# Patient Record
Sex: Female | Born: 1980 | Race: Black or African American | Hispanic: No | Marital: Single | Smoking: Current every day smoker
Health system: Southern US, Community
[De-identification: ages and names within clinical notes are randomized; demographics above are authoritative.]

## PROBLEM LIST (undated history)

## (undated) ENCOUNTER — Inpatient Hospital Stay (HOSPITAL_COMMUNITY): Payer: Self-pay

## (undated) DIAGNOSIS — K859 Acute pancreatitis without necrosis or infection, unspecified: Secondary | ICD-10-CM

## (undated) DIAGNOSIS — D259 Leiomyoma of uterus, unspecified: Secondary | ICD-10-CM

## (undated) DIAGNOSIS — A64 Unspecified sexually transmitted disease: Secondary | ICD-10-CM

## (undated) HISTORY — PX: NO PAST SURGERIES: SHX2092

---

## 2002-10-02 ENCOUNTER — Encounter (INDEPENDENT_AMBULATORY_CARE_PROVIDER_SITE_OTHER): Payer: Self-pay | Admitting: *Deleted

## 2002-10-02 ENCOUNTER — Encounter: Payer: Self-pay | Admitting: Obstetrics and Gynecology

## 2002-10-02 ENCOUNTER — Ambulatory Visit (HOSPITAL_COMMUNITY): Admission: AD | Admit: 2002-10-02 | Discharge: 2002-10-02 | Payer: Self-pay | Admitting: Obstetrics and Gynecology

## 2005-03-07 ENCOUNTER — Encounter: Admission: RE | Admit: 2005-03-07 | Discharge: 2005-03-07 | Payer: Self-pay | Admitting: Occupational Medicine

## 2009-06-20 ENCOUNTER — Ambulatory Visit: Payer: Self-pay | Admitting: Radiology

## 2009-06-20 ENCOUNTER — Emergency Department (HOSPITAL_BASED_OUTPATIENT_CLINIC_OR_DEPARTMENT_OTHER): Admission: EM | Admit: 2009-06-20 | Discharge: 2009-06-20 | Payer: Self-pay | Admitting: Emergency Medicine

## 2009-07-14 ENCOUNTER — Ambulatory Visit: Payer: Self-pay | Admitting: Diagnostic Radiology

## 2009-07-14 ENCOUNTER — Emergency Department (HOSPITAL_BASED_OUTPATIENT_CLINIC_OR_DEPARTMENT_OTHER): Admission: EM | Admit: 2009-07-14 | Discharge: 2009-07-14 | Payer: Self-pay | Admitting: Emergency Medicine

## 2010-08-25 ENCOUNTER — Emergency Department (HOSPITAL_BASED_OUTPATIENT_CLINIC_OR_DEPARTMENT_OTHER)
Admission: EM | Admit: 2010-08-25 | Discharge: 2010-08-26 | Disposition: A | Discharge status: Home / Self Care | Payer: Self-pay | Attending: Emergency Medicine | Admitting: Emergency Medicine

## 2010-08-25 DIAGNOSIS — T148 Other injury of unspecified body region: Secondary | ICD-10-CM | POA: Insufficient documentation

## 2010-08-25 DIAGNOSIS — R21 Rash and other nonspecific skin eruption: Secondary | ICD-10-CM | POA: Insufficient documentation

## 2010-08-25 DIAGNOSIS — Y92009 Unspecified place in unspecified non-institutional (private) residence as the place of occurrence of the external cause: Secondary | ICD-10-CM | POA: Insufficient documentation

## 2010-08-25 DIAGNOSIS — W57XXXA Bitten or stung by nonvenomous insect and other nonvenomous arthropods, initial encounter: Secondary | ICD-10-CM | POA: Insufficient documentation

## 2010-08-26 LAB — RAPID STREP SCREEN (MED CTR MEBANE ONLY): Streptococcus, Group A Screen (Direct): NEGATIVE

## 2010-10-22 NOTE — Op Note (Signed)
   NAME:  Ariana Gutierrez, Ariana Gutierrez                         ACCOUNT NO.:  [id number]   MEDICAL RECORD NO.:  [id number]                   PATIENT TYPE:  AMB   LOCATION:  SDC                                  FACILITY:  [facility]   PHYSICIAN:  Phil D. Okey Dupre, M.D.                  DATE OF BIRTH:  [birth year]   DATE OF PROCEDURE:  10/02/2002  DATE OF DISCHARGE:                                 OPERATIVE REPORT   PROCEDURE:  Dilatation and evacuation.   PREOPERATIVE DIAGNOSIS:  Missed and incomplete abortion.   POSTOPERATIVE DIAGNOSIS:  Missed and incomplete abortion.   SURGEON:  Javier Glazier. Okey Dupre, M.D.   ANESTHESIA:  General.   OPERATIVE FINDINGS:  A cervix that was dilated to easily allow a finger  through the cervical os, a uterus that was soft and boggy and about eight  weeks gestational size, and a large amount of secundine material.   DESCRIPTION OF PROCEDURE:  Under satisfactory general anesthesia with the  patient in the dorsal lithotomy position the perineum and vagina were  prepped and draped in the usual sterile manner.  Bimanual pelvic examination  was as above.  A weighted speculum was placed in the posterior fourchet of  the marital introitus.  BUS were within normal limits.  Vagina was clean and  well rugated with a large amount of clot within the cavity.  The anterior  lip of the cervix was grasped with a single tooth tenaculum.  The uterine  cavity sounded to about 9 cm.  The cervical os easily passed a #10 Hagar  dilator and a large curette was used to evacuate the uterine contents  without incident.  The uterine cavity was smooth with no abnormalities  noted.  The tenaculum and speculum were removed from the vagina.  The  patient was transferred to the recovery room in satisfactory condition,  having tolerated the procedure well.  The products of conception removed  from the uterine cavity were sent for pathological diagnosis.                                               Phil D. Okey Dupre,  M.D.    PDR/MEDQ  D:  10/02/2002  T:  10/02/2002  Job:  [id number]

## 2015-10-01 DIAGNOSIS — Z6831 Body mass index (BMI) 31.0-31.9, adult: Secondary | ICD-10-CM | POA: Diagnosis not present

## 2015-10-01 DIAGNOSIS — L03115 Cellulitis of right lower limb: Secondary | ICD-10-CM | POA: Diagnosis not present

## 2015-10-01 DIAGNOSIS — T310 Burns involving less than 10% of body surface: Secondary | ICD-10-CM | POA: Diagnosis not present

## 2015-10-06 DIAGNOSIS — T310 Burns involving less than 10% of body surface: Secondary | ICD-10-CM | POA: Diagnosis not present

## 2015-10-06 DIAGNOSIS — Z6831 Body mass index (BMI) 31.0-31.9, adult: Secondary | ICD-10-CM | POA: Diagnosis not present

## 2015-10-13 DIAGNOSIS — T300 Burn of unspecified body region, unspecified degree: Secondary | ICD-10-CM | POA: Diagnosis not present

## 2015-10-13 DIAGNOSIS — T310 Burns involving less than 10% of body surface: Secondary | ICD-10-CM | POA: Diagnosis not present

## 2015-10-13 DIAGNOSIS — Z23 Encounter for immunization: Secondary | ICD-10-CM | POA: Diagnosis not present

## 2015-10-13 DIAGNOSIS — Z6831 Body mass index (BMI) 31.0-31.9, adult: Secondary | ICD-10-CM | POA: Diagnosis not present

## 2015-10-16 DIAGNOSIS — T310 Burns involving less than 10% of body surface: Secondary | ICD-10-CM | POA: Diagnosis not present

## 2015-10-16 DIAGNOSIS — T300 Burn of unspecified body region, unspecified degree: Secondary | ICD-10-CM | POA: Diagnosis not present

## 2015-10-16 DIAGNOSIS — Z6831 Body mass index (BMI) 31.0-31.9, adult: Secondary | ICD-10-CM | POA: Diagnosis not present

## 2016-01-08 DIAGNOSIS — Z3480 Encounter for supervision of other normal pregnancy, unspecified trimester: Secondary | ICD-10-CM | POA: Diagnosis not present

## 2016-01-13 DIAGNOSIS — O26899 Other specified pregnancy related conditions, unspecified trimester: Secondary | ICD-10-CM | POA: Diagnosis not present

## 2016-01-13 DIAGNOSIS — O09521 Supervision of elderly multigravida, first trimester: Secondary | ICD-10-CM | POA: Diagnosis not present

## 2016-01-13 DIAGNOSIS — Z3A08 8 weeks gestation of pregnancy: Secondary | ICD-10-CM | POA: Diagnosis not present

## 2016-01-28 ENCOUNTER — Other Ambulatory Visit (HOSPITAL_COMMUNITY)
Admission: RE | Admit: 2016-01-28 | Discharge: 2016-01-28 | Disposition: A | Discharge status: Home / Self Care | Payer: BLUE CROSS/BLUE SHIELD | Source: Ambulatory Visit | Attending: Obstetrics & Gynecology | Admitting: Obstetrics & Gynecology

## 2016-01-28 ENCOUNTER — Other Ambulatory Visit: Payer: Self-pay | Admitting: Obstetrics & Gynecology

## 2016-01-28 DIAGNOSIS — Z1151 Encounter for screening for human papillomavirus (HPV): Secondary | ICD-10-CM | POA: Diagnosis not present

## 2016-01-28 DIAGNOSIS — Z113 Encounter for screening for infections with a predominantly sexual mode of transmission: Secondary | ICD-10-CM | POA: Insufficient documentation

## 2016-01-28 DIAGNOSIS — Z01419 Encounter for gynecological examination (general) (routine) without abnormal findings: Secondary | ICD-10-CM | POA: Insufficient documentation

## 2016-01-28 DIAGNOSIS — O0991 Supervision of high risk pregnancy, unspecified, first trimester: Secondary | ICD-10-CM | POA: Diagnosis not present

## 2016-02-01 LAB — CYTOLOGY - PAP

## 2016-02-23 ENCOUNTER — Inpatient Hospital Stay (HOSPITAL_COMMUNITY): Payer: BLUE CROSS/BLUE SHIELD

## 2016-02-23 ENCOUNTER — Encounter (HOSPITAL_COMMUNITY): Payer: Self-pay | Admitting: *Deleted

## 2016-02-23 ENCOUNTER — Inpatient Hospital Stay (HOSPITAL_COMMUNITY)
Admission: AD | Admit: 2016-02-23 | Discharge: 2016-02-23 | Disposition: A | Discharge status: Home / Self Care | Payer: BLUE CROSS/BLUE SHIELD | Source: Ambulatory Visit | Attending: Obstetrics & Gynecology | Admitting: Obstetrics & Gynecology

## 2016-02-23 DIAGNOSIS — O3412 Maternal care for benign tumor of corpus uteri, second trimester: Secondary | ICD-10-CM | POA: Insufficient documentation

## 2016-02-23 DIAGNOSIS — O209 Hemorrhage in early pregnancy, unspecified: Secondary | ICD-10-CM

## 2016-02-23 DIAGNOSIS — O4692 Antepartum hemorrhage, unspecified, second trimester: Secondary | ICD-10-CM

## 2016-02-23 DIAGNOSIS — Z3A14 14 weeks gestation of pregnancy: Secondary | ICD-10-CM | POA: Diagnosis not present

## 2016-02-23 DIAGNOSIS — D259 Leiomyoma of uterus, unspecified: Secondary | ICD-10-CM | POA: Diagnosis not present

## 2016-02-23 HISTORY — DX: Unspecified sexually transmitted disease: A64

## 2016-02-23 HISTORY — DX: Leiomyoma of uterus, unspecified: D25.9

## 2016-02-23 LAB — CBC
HCT: 34.6 % — ABNORMAL LOW (ref 36.0–46.0)
HEMOGLOBIN: 12.5 g/dL (ref 12.0–15.0)
MCH: 35.4 pg — ABNORMAL HIGH (ref 26.0–34.0)
MCHC: 36.1 g/dL — ABNORMAL HIGH (ref 30.0–36.0)
MCV: 98 fL (ref 78.0–100.0)
Platelets: 283 10*3/uL (ref 150–400)
RBC: 3.53 MIL/uL — ABNORMAL LOW (ref 3.87–5.11)
RDW: 12.2 % (ref 11.5–15.5)
WBC: 11.1 10*3/uL — AB (ref 4.0–10.5)

## 2016-02-23 LAB — ABO/RH: ABO/RH(D): B POS

## 2016-02-23 NOTE — Discharge Instructions (Signed)
Uterine Fibroids Uterine fibroids are tissue masses (tumors) that can develop in the womb (uterus). They are also called leiomyomas. This type of tumor is not cancerous (benign) and does not spread to other parts of the body outside of the pelvic area, which is between the hip bones. Occasionally, fibroids may develop in the fallopian tubes, in the cervix, or on the support structures (ligaments) that surround the uterus. You can have one or many fibroids. Fibroids can vary in size, weight, and where they grow in the uterus. Some can become quite large. Most fibroids do not require medical treatment. CAUSES A fibroid can develop when a single uterine cell keeps growing (replicating). Most cells in the human body have a control mechanism that keeps them from replicating without control. SIGNS AND SYMPTOMS Symptoms may include:   Heavy bleeding during your period.  Bleeding or spotting between periods.  Pelvic pain and pressure.  Bladder problems, such as needing to urinate more often (urinary frequency) or urgently.  Inability to reproduce offspring (infertility).  Miscarriages. DIAGNOSIS Uterine fibroids are diagnosed through a physical exam. Your health care provider may feel the lumpy tumors during a pelvic exam. Ultrasonography and an MRI may be done to determine the size, location, and number of fibroids. TREATMENT Treatment may include:  Watchful waiting. This involves getting the fibroid checked by your health care provider to see if it grows or shrinks. Follow your health care provider's recommendations for how often to have this checked.  Hormone medicines. These can be taken by mouth or given through an intrauterine device (IUD).  Surgery.  Removing the fibroids (myomectomy) or the uterus (hysterectomy).  Removing blood supply to the fibroids (uterine artery embolization). If fibroids interfere with your fertility and you want to become pregnant, your health care provider  may recommend having the fibroids removed.  HOME CARE INSTRUCTIONS  Keep all follow-up visits as directed by your health care provider. This is important.  Take medicines only as directed by your health care provider.  If you were prescribed a hormone treatment, take the hormone medicines exactly as directed.  Do not take aspirin, because it can cause bleeding.  Ask your health care provider about taking iron pills and increasing the amount of dark green, leafy vegetables in your diet. These actions can help to boost your blood iron levels, which may be affected by heavy menstrual bleeding.  Pay close attention to your period and tell your health care provider about any changes, such as:  Increased blood flow that requires you to use more pads or tampons than usual per month.  A change in the number of days that your period lasts per month.  A change in symptoms that are associated with your period, such as abdominal cramping or back pain. SEEK MEDICAL CARE IF:  You have pelvic pain, back pain, or abdominal cramps that cannot be controlled with medicines.  You have an increase in bleeding between and during periods.  You soak tampons or pads in a half hour or less.  You feel lightheaded, extra tired, or weak. SEEK IMMEDIATE MEDICAL CARE IF:  You faint.  You have a sudden increase in pelvic pain.   This information is not intended to replace advice given to you by your health care provider. Make sure you discuss any questions you have with your health care provider.   Document Released: 05/20/2000 Document Revised: 06/13/2014 Document Reviewed: 11/19/2013 Elsevier Interactive Patient Education 2016 [place].  Pelvic Rest Pelvic rest is sometimes  recommended for women when:   The placenta is partially or completely covering the opening of the cervix (placenta previa).  There is bleeding between the uterine wall and the amniotic sac in the first trimester (subchorionic  hemorrhage).  The cervix begins to open without labor starting (incompetent cervix, cervical insufficiency).  The labor is too early (preterm labor). HOME CARE INSTRUCTIONS  Do not have sexual intercourse, stimulation, or an orgasm.  Do not use tampons, douche, or put anything in the vagina.  Do not lift anything over 10 pounds (4.5 kg).  Avoid strenuous activity or straining your pelvic muscles. SEEK MEDICAL CARE IF:  You have any vaginal bleeding during pregnancy. Treat this as a potential emergency.  You have cramping pain felt low in the stomach (stronger than menstrual cramps).  You notice vaginal discharge (watery, mucus, or bloody).  You have a low, dull backache.  There are regular contractions or uterine tightening. SEEK IMMEDIATE MEDICAL CARE IF: You have vaginal bleeding and have placenta previa.    This information is not intended to replace advice given to you by your health care provider. Make sure you discuss any questions you have with your health care provider.   Document Released: 09/17/2010 Document Revised: 08/15/2011 Document Reviewed: 11/24/2014 Elsevier Interactive Patient Education 2016 [employer] Inc[employer]   Vaginal Bleeding During Pregnancy, Second Trimester A small amount of bleeding (spotting) from the vagina is relatively common in pregnancy. It usually stops on its own. Various things can cause bleeding or spotting in pregnancy. Some bleeding may be related to the pregnancy, and some may not. Sometimes the bleeding is normal and is not a problem. However, bleeding can also be a sign of something serious. Be sure to tell your health care provider about any vaginal bleeding right away. Some possible causes of vaginal bleeding during the second trimester include:  Infection, inflammation, or growths on the cervix.   The placenta may be partially or completely covering the opening of the cervix inside the uterus (placenta previa).  The placenta may have  separated from the uterus (abruption of the placenta).   You may be having early (preterm) labor.   The cervix may not be strong enough to keep a baby inside the uterus (cervical insufficiency).   Tiny cysts may have developed in the uterus instead of pregnancy tissue (molar pregnancy). HOME CARE INSTRUCTIONS  Watch your condition for any changes. The following actions may help to lessen any discomfort you are feeling:  Follow your health care provider's instructions for limiting your activity. If your health care provider orders bed rest, you may need to stay in bed and only get up to use the bathroom. However, your health care provider may allow you to continue light activity.  If needed, make plans for someone to help with your regular activities and responsibilities while you are on bed rest.  Keep track of the number of pads you use each day, how often you change pads, and how soaked (saturated) they are. Write this down.  Do not use tampons. Do not douche.  Do not have sexual intercourse or orgasms until approved by your health care provider.  If you pass any tissue from your vagina, save the tissue so you can show it to your health care provider.  Only take over-the-counter or prescription medicines as directed by your health care provider.  Do not take aspirin because it can make you bleed.  Do not exercise or perform any strenuous activities or heavy lifting without  your health care provider's permission.  Keep all follow-up appointments as directed by your health care provider. SEEK MEDICAL CARE IF:  You have any vaginal bleeding during any part of your pregnancy.  You have cramps or labor pains.  You have a fever, not controlled by medicine. SEEK IMMEDIATE MEDICAL CARE IF:   You have severe cramps in your back or belly (abdomen).  You have contractions.  You have chills.  You pass large clots or tissue from your vagina.  Your bleeding increases.  You  feel light-headed or weak, or you have fainting episodes.  You are leaking fluid or have a gush of fluid from your vagina. MAKE SURE YOU:  Understand these instructions.  Will watch your condition.  Will get help right away if you are not doing well or get worse.   This information is not intended to replace advice given to you by your health care provider. Make sure you discuss any questions you have with your health care provider.   Document Released: 03/02/2005 Document Revised: 05/28/2013 Document Reviewed: 01/28/2013 Elsevier Interactive Patient Education [employer].

## 2016-02-23 NOTE — MAU Note (Signed)
Pt states bleeding started @ 1440, felt a cramp at work, felt gushing.  Denies pain now.  Has bled through clothes, had large pad on outside of clothes.

## 2016-02-23 NOTE — MAU Provider Note (Signed)
History     CSN: [id number]  Arrival date and time: 02/23/16 1544   First Provider Initiated Contact with Patient 02/23/16 1905      Chief Complaint  Patient presents with  . Vaginal Bleeding   HPI Ariana Gutierrez is a 35 y.o. G3P1011 at [redacted]w[redacted]d who presents with vaginal bleeding. Reports heavy vaginal bleeding began ~1 hour PTA. States she saturated her clothes & blood was running down her legs. Had abdominal pain at moment the bleeding began, but none since then. Denies bleeding at this time.  See Dr. Nelda Marseille. Was told a few months ago that she has "some fibroids". Next appt with Dr. Nelda Marseille is this Friday.   OB History    Gravida Para Term Preterm AB Living   3 1 1   1 1    SAB TAB Ectopic Multiple Live Births   1 0     1      Past Medical History:  Diagnosis Date  . STI (sexually transmitted infection)    hx of gonorrhea & chlamydia "years ago"  . Uterine fibroid     Past Surgical History:  Procedure Laterality Date  . NO PAST SURGERIES      Family History  Problem Relation Age of Onset  . Hypertension Mother   . Diabetes Maternal Aunt   . Asthma Neg Hx   . Cancer Neg Hx   . Heart disease Neg Hx     Social History  Substance Use Topics  . Smoking status: Not on file  . Smokeless tobacco: Not on file  . Alcohol use Not on file    Allergies: No Known Allergies  No prescriptions prior to admission.    Review of Systems  Gastrointestinal: Negative.   Genitourinary:       + vaginal bleeding   Physical Exam   Blood pressure 118/72, pulse 88, temperature 97.5 F (36.4 C), temperature source Oral, resp. rate 18.  Physical Exam  Nursing note and vitals reviewed. Constitutional: She is oriented to person, place, and time. She appears well-developed and well-nourished. No distress.  HENT:  Head: Normocephalic and atraumatic.  Eyes: Conjunctivae are normal. Right eye exhibits no discharge. Left eye exhibits no discharge. No scleral icterus.  Neck: Normal range  of motion.  Cardiovascular: Normal rate.   Respiratory: Effort normal. No respiratory distress.  GI: Soft. There is no tenderness.  Genitourinary:  Genitourinary Comments: Small amount of dark red blood ~4 cm blood clot removed from canal Cervix closed  Neurological: She is alert and oriented to person, place, and time.  Skin: Skin is warm and dry. She is not diaphoretic.  Psychiatric: She has a normal mood and affect. Her behavior is normal. Judgment and thought content normal.    MAU Course  Procedures Results for orders placed or performed during the hospital encounter of 02/23/16 (from the past 24 hour(s))  CBC     Status: Abnormal   Collection Time: 02/23/16  5:54 PM  Result Value Ref Range   WBC 11.1 (H) 4.0 - 10.5 K/uL   RBC 3.53 (L) 3.87 - 5.11 MIL/uL   Hemoglobin 12.5 12.0 - 15.0 g/dL   HCT 34.6 (L) 36.0 - 46.0 %   MCV 98.0 78.0 - 100.0 fL   MCH 35.4 (H) 26.0 - 34.0 pg   MCHC 36.1 (H) 30.0 - 36.0 g/dL   RDW 12.2 11.5 - 15.5 %   Platelets 283 150 - 400 K/uL  ABO/Rh     Status: None (Preliminary  result)   Collection Time: 02/23/16  5:54 PM  Result Value Ref Range   ABO/RH(D) B POS     MDM FHT 154 by doppler B positive CBC & ultrasound Cervix closed Ultrasound shows SIUP with cardiac activity, anterior placenta, & multiple uterine fibroids  Discussed pt with Dr. Simona Huh & Dr. Landry Mellow. Dr. Landry Mellow in to see pt for reassurance. Pt to be discharged home.  Assessment and Plan  A: 1. Uterine fibroids affecting pregnancy in second trimester   2. [redacted] weeks gestation of pregnancy   3. Vaginal bleeding in pregnancy, second trimester     P: Discharge home Pelvic rest No work until f/u with Dr. Nelda Marseille Discussed reasons to return to MAU Keep appt with Dr. Nelda Marseille on Friday  Jorje Guild 02/23/2016, 4:28 PM

## 2016-02-26 DIAGNOSIS — Z3482 Encounter for supervision of other normal pregnancy, second trimester: Secondary | ICD-10-CM | POA: Diagnosis not present

## 2016-03-17 DIAGNOSIS — Z3480 Encounter for supervision of other normal pregnancy, unspecified trimester: Secondary | ICD-10-CM | POA: Diagnosis not present

## 2016-03-17 DIAGNOSIS — Z23 Encounter for immunization: Secondary | ICD-10-CM | POA: Diagnosis not present

## 2016-03-30 DIAGNOSIS — Z3A19 19 weeks gestation of pregnancy: Secondary | ICD-10-CM | POA: Diagnosis not present

## 2016-03-30 DIAGNOSIS — Z36 Encounter for antenatal screening for chromosomal anomalies: Secondary | ICD-10-CM | POA: Diagnosis not present

## 2016-03-30 DIAGNOSIS — O09522 Supervision of elderly multigravida, second trimester: Secondary | ICD-10-CM | POA: Diagnosis not present

## 2016-03-30 DIAGNOSIS — O3412 Maternal care for benign tumor of corpus uteri, second trimester: Secondary | ICD-10-CM | POA: Diagnosis not present

## 2016-05-04 DIAGNOSIS — N898 Other specified noninflammatory disorders of vagina: Secondary | ICD-10-CM | POA: Diagnosis not present

## 2016-05-16 DIAGNOSIS — R109 Unspecified abdominal pain: Secondary | ICD-10-CM | POA: Diagnosis not present

## 2016-05-16 DIAGNOSIS — O0992 Supervision of high risk pregnancy, unspecified, second trimester: Secondary | ICD-10-CM | POA: Diagnosis not present

## 2016-05-16 DIAGNOSIS — O26899 Other specified pregnancy related conditions, unspecified trimester: Secondary | ICD-10-CM | POA: Diagnosis not present

## 2016-05-18 DIAGNOSIS — O09529 Supervision of elderly multigravida, unspecified trimester: Secondary | ICD-10-CM | POA: Diagnosis not present

## 2016-05-18 DIAGNOSIS — O2612 Low weight gain in pregnancy, second trimester: Secondary | ICD-10-CM | POA: Diagnosis not present

## 2016-05-18 DIAGNOSIS — D259 Leiomyoma of uterus, unspecified: Secondary | ICD-10-CM | POA: Diagnosis not present

## 2016-05-18 DIAGNOSIS — O3412 Maternal care for benign tumor of corpus uteri, second trimester: Secondary | ICD-10-CM | POA: Diagnosis not present

## 2016-06-06 NOTE — L&D Delivery Note (Signed)
Delivery Note At 12:31 PM a viable female was delivered via Vaginal, Spontaneous Delivery (Presentation: ROA ).  APGAR: 8, 9; weight 6 lb 13.2 oz (3095 g).   Placenta status; L&D .  Cord:  with the following complications: none Cord pH: not collected  Anesthesia:  epidural Episiotomy: None Lacerations: 1st degree;Vaginal Suture Repair: 3.0 vicryl Est. Blood Loss (mL): 350  Mom to postpartum.  Baby to Couplet care / Skin to Skin.  Janyth Pupa, M 08/02/2016, 2:57 PM

## 2016-06-15 DIAGNOSIS — O3413 Maternal care for benign tumor of corpus uteri, third trimester: Secondary | ICD-10-CM | POA: Diagnosis not present

## 2016-06-15 DIAGNOSIS — O09529 Supervision of elderly multigravida, unspecified trimester: Secondary | ICD-10-CM | POA: Diagnosis not present

## 2016-07-01 DIAGNOSIS — O3413 Maternal care for benign tumor of corpus uteri, third trimester: Secondary | ICD-10-CM | POA: Diagnosis not present

## 2016-07-11 DIAGNOSIS — O09529 Supervision of elderly multigravida, unspecified trimester: Secondary | ICD-10-CM | POA: Diagnosis not present

## 2016-07-11 DIAGNOSIS — O3413 Maternal care for benign tumor of corpus uteri, third trimester: Secondary | ICD-10-CM | POA: Diagnosis not present

## 2016-07-21 LAB — OB RESULTS CONSOLE GBS: GBS: POSITIVE

## 2016-08-02 ENCOUNTER — Inpatient Hospital Stay (HOSPITAL_COMMUNITY): Payer: BLUE CROSS/BLUE SHIELD | Admitting: Anesthesiology

## 2016-08-02 ENCOUNTER — Inpatient Hospital Stay (HOSPITAL_COMMUNITY)
Admission: AD | Admit: 2016-08-02 | Discharge: 2016-08-03 | DRG: 775 | Disposition: A | Discharge status: Home / Self Care | Payer: BLUE CROSS/BLUE SHIELD | Source: Ambulatory Visit | Attending: Obstetrics and Gynecology | Admitting: Obstetrics and Gynecology

## 2016-08-02 ENCOUNTER — Encounter (HOSPITAL_COMMUNITY): Payer: Self-pay

## 2016-08-02 DIAGNOSIS — E669 Obesity, unspecified: Secondary | ICD-10-CM | POA: Diagnosis not present

## 2016-08-02 DIAGNOSIS — Z3A37 37 weeks gestation of pregnancy: Secondary | ICD-10-CM

## 2016-08-02 DIAGNOSIS — O4202 Full-term premature rupture of membranes, onset of labor within 24 hours of rupture: Secondary | ICD-10-CM | POA: Diagnosis not present

## 2016-08-02 DIAGNOSIS — Z833 Family history of diabetes mellitus: Secondary | ICD-10-CM

## 2016-08-02 DIAGNOSIS — O99824 Streptococcus B carrier state complicating childbirth: Secondary | ICD-10-CM | POA: Diagnosis not present

## 2016-08-02 DIAGNOSIS — O99214 Obesity complicating childbirth: Secondary | ICD-10-CM | POA: Diagnosis not present

## 2016-08-02 DIAGNOSIS — A5901 Trichomonal vulvovaginitis: Secondary | ICD-10-CM | POA: Diagnosis not present

## 2016-08-02 DIAGNOSIS — Z8249 Family history of ischemic heart disease and other diseases of the circulatory system: Secondary | ICD-10-CM

## 2016-08-02 DIAGNOSIS — Z6831 Body mass index (BMI) 31.0-31.9, adult: Secondary | ICD-10-CM | POA: Diagnosis not present

## 2016-08-02 DIAGNOSIS — D259 Leiomyoma of uterus, unspecified: Secondary | ICD-10-CM | POA: Diagnosis not present

## 2016-08-02 DIAGNOSIS — O09529 Supervision of elderly multigravida, unspecified trimester: Secondary | ICD-10-CM

## 2016-08-02 DIAGNOSIS — O3413 Maternal care for benign tumor of corpus uteri, third trimester: Secondary | ICD-10-CM | POA: Diagnosis present

## 2016-08-02 DIAGNOSIS — Z9289 Personal history of other medical treatment: Secondary | ICD-10-CM | POA: Diagnosis present

## 2016-08-02 DIAGNOSIS — O23592 Infection of other part of genital tract in pregnancy, second trimester: Secondary | ICD-10-CM

## 2016-08-02 DIAGNOSIS — B951 Streptococcus, group B, as the cause of diseases classified elsewhere: Secondary | ICD-10-CM | POA: Diagnosis present

## 2016-08-02 LAB — CBC
HCT: 36.8 % (ref 36.0–46.0)
HEMOGLOBIN: 12.4 g/dL (ref 12.0–15.0)
MCH: 32.7 pg (ref 26.0–34.0)
MCHC: 33.7 g/dL (ref 30.0–36.0)
MCV: 97.1 fL (ref 78.0–100.0)
Platelets: 299 10*3/uL (ref 150–400)
RBC: 3.79 MIL/uL — ABNORMAL LOW (ref 3.87–5.11)
RDW: 14.3 % (ref 11.5–15.5)
WBC: 10.8 10*3/uL — AB (ref 4.0–10.5)

## 2016-08-02 LAB — OB RESULTS CONSOLE HEPATITIS B SURFACE ANTIGEN: HEP B S AG: NEGATIVE

## 2016-08-02 LAB — OB RESULTS CONSOLE RPR: RPR: NONREACTIVE

## 2016-08-02 LAB — OB RESULTS CONSOLE HIV ANTIBODY (ROUTINE TESTING): HIV: NONREACTIVE

## 2016-08-02 LAB — TYPE AND SCREEN
ABO/RH(D): B POS
ANTIBODY SCREEN: NEGATIVE

## 2016-08-02 LAB — OB RESULTS CONSOLE RUBELLA ANTIBODY, IGM: RUBELLA: IMMUNE

## 2016-08-02 LAB — POCT FERN TEST: POCT Fern Test: POSITIVE

## 2016-08-02 MED ORDER — LIDOCAINE HCL (PF) 1 % IJ SOLN
INTRAMUSCULAR | Status: DC | PRN
  Administered 2016-08-02 (×2): 4 mL via EPIDURAL

## 2016-08-02 MED ORDER — METHYLERGONOVINE MALEATE 0.2 MG/ML IJ SOLN
INTRAMUSCULAR | Status: AC
  Filled 2016-08-02: qty 1

## 2016-08-02 MED ORDER — BUTORPHANOL TARTRATE 1 MG/ML IJ SOLN
2.0000 mg | Freq: Once | INTRAMUSCULAR | Status: AC
  Administered 2016-08-02: 2 mg via INTRAVENOUS
  Filled 2016-08-02: qty 2

## 2016-08-02 MED ORDER — PHENYLEPHRINE 40 MCG/ML (10ML) SYRINGE FOR IV PUSH (FOR BLOOD PRESSURE SUPPORT)
80.0000 ug | PREFILLED_SYRINGE | INTRAVENOUS | Status: DC | PRN
  Filled 2016-08-02: qty 10
  Filled 2016-08-02: qty 5

## 2016-08-02 MED ORDER — IBUPROFEN 600 MG PO TABS
600.0000 mg | ORAL_TABLET | Freq: Four times a day (QID) | ORAL | Status: DC
  Administered 2016-08-02 – 2016-08-03 (×4): 600 mg via ORAL
  Filled 2016-08-02 (×5): qty 1

## 2016-08-02 MED ORDER — BENZOCAINE-MENTHOL 20-0.5 % EX AERO
1.0000 "application " | INHALATION_SPRAY | CUTANEOUS | Status: DC | PRN
  Administered 2016-08-03: 1 via TOPICAL
  Filled 2016-08-02: qty 56

## 2016-08-02 MED ORDER — ONDANSETRON HCL 4 MG/2ML IJ SOLN: 4.0000 mg | INTRAMUSCULAR | Status: DC | PRN

## 2016-08-02 MED ORDER — LACTATED RINGERS IV SOLN: 500.0000 mL | INTRAVENOUS | Status: DC | PRN

## 2016-08-02 MED ORDER — ONDANSETRON HCL 4 MG PO TABS: 4.0000 mg | ORAL_TABLET | ORAL | Status: DC | PRN

## 2016-08-02 MED ORDER — FLEET ENEMA 7-19 GM/118ML RE ENEM: 1.0000 | ENEMA | RECTAL | Status: DC | PRN

## 2016-08-02 MED ORDER — OXYTOCIN BOLUS FROM INFUSION
500.0000 mL | Freq: Once | INTRAVENOUS | Status: AC
  Administered 2016-08-02: 500 mL via INTRAVENOUS

## 2016-08-02 MED ORDER — ACETAMINOPHEN 325 MG PO TABS: 650.0000 mg | ORAL_TABLET | ORAL | Status: DC | PRN

## 2016-08-02 MED ORDER — ONDANSETRON HCL 4 MG/2ML IJ SOLN: 4.0000 mg | Freq: Four times a day (QID) | INTRAMUSCULAR | Status: DC | PRN

## 2016-08-02 MED ORDER — PENICILLIN G POT IN DEXTROSE 60000 UNIT/ML IV SOLN
3.0000 10*6.[IU] | INTRAVENOUS | Status: DC
  Administered 2016-08-02: 3 10*6.[IU] via INTRAVENOUS
  Filled 2016-08-02 (×8): qty 50

## 2016-08-02 MED ORDER — MISOPROSTOL 200 MCG PO TABS
ORAL_TABLET | ORAL | Status: AC
  Filled 2016-08-02: qty 5

## 2016-08-02 MED ORDER — OXYTOCIN 40 UNITS IN LACTATED RINGERS INFUSION - SIMPLE MED: 2.5000 [IU]/h | INTRAVENOUS | Status: DC | PRN

## 2016-08-02 MED ORDER — ACETAMINOPHEN 325 MG PO TABS
650.0000 mg | ORAL_TABLET | ORAL | Status: DC | PRN
  Administered 2016-08-03: 650 mg via ORAL
  Filled 2016-08-02: qty 2

## 2016-08-02 MED ORDER — LACTATED RINGERS IV SOLN
INTRAVENOUS | Status: DC
Start: 2016-08-02 — End: 2016-08-02
  Administered 2016-08-02: 08:00:00 via INTRAVENOUS
  Administered 2016-08-02: 125 mL/h via INTRAVENOUS

## 2016-08-02 MED ORDER — PHENYLEPHRINE 40 MCG/ML (10ML) SYRINGE FOR IV PUSH (FOR BLOOD PRESSURE SUPPORT)
80.0000 ug | PREFILLED_SYRINGE | INTRAVENOUS | Status: DC | PRN
  Filled 2016-08-02: qty 5

## 2016-08-02 MED ORDER — OXYCODONE-ACETAMINOPHEN 5-325 MG PO TABS: 1.0000 | ORAL_TABLET | ORAL | Status: DC | PRN

## 2016-08-02 MED ORDER — EPHEDRINE 5 MG/ML INJ
10.0000 mg | INTRAVENOUS | Status: DC | PRN
  Filled 2016-08-02: qty 4

## 2016-08-02 MED ORDER — FENTANYL 2.5 MCG/ML BUPIVACAINE 1/10 % EPIDURAL INFUSION (WH - ANES)
14.0000 mL/h | INTRAMUSCULAR | Status: DC | PRN
  Administered 2016-08-02: 14 mL/h via EPIDURAL
  Filled 2016-08-02: qty 100

## 2016-08-02 MED ORDER — DIPHENHYDRAMINE HCL 50 MG/ML IJ SOLN: 12.5000 mg | INTRAMUSCULAR | Status: DC | PRN

## 2016-08-02 MED ORDER — PENICILLIN G POTASSIUM 5000000 UNITS IJ SOLR
5.0000 10*6.[IU] | Freq: Once | INTRAVENOUS | Status: AC
  Administered 2016-08-02: 5 10*6.[IU] via INTRAVENOUS
  Filled 2016-08-02: qty 5

## 2016-08-02 MED ORDER — OXYTOCIN 40 UNITS IN LACTATED RINGERS INFUSION - SIMPLE MED
2.5000 [IU]/h | INTRAVENOUS | Status: DC
  Filled 2016-08-02: qty 1000

## 2016-08-02 MED ORDER — ZOLPIDEM TARTRATE 5 MG PO TABS: 5.0000 mg | ORAL_TABLET | Freq: Every evening | ORAL | Status: DC | PRN

## 2016-08-02 MED ORDER — LACTATED RINGERS IV SOLN: 500.0000 mL | Freq: Once | INTRAVENOUS | Status: DC

## 2016-08-02 MED ORDER — PRENATAL MULTIVITAMIN CH
1.0000 | ORAL_TABLET | Freq: Every day | ORAL | Status: DC
  Administered 2016-08-03: 1 via ORAL
  Filled 2016-08-02: qty 1

## 2016-08-02 MED ORDER — SIMETHICONE 80 MG PO CHEW: 80.0000 mg | CHEWABLE_TABLET | ORAL | Status: DC | PRN

## 2016-08-02 MED ORDER — OXYCODONE-ACETAMINOPHEN 5-325 MG PO TABS: 2.0000 | ORAL_TABLET | ORAL | Status: DC | PRN

## 2016-08-02 MED ORDER — COCONUT OIL OIL: 1.0000 "application " | TOPICAL_OIL | Status: DC | PRN

## 2016-08-02 MED ORDER — DIPHENHYDRAMINE HCL 25 MG PO CAPS: 25.0000 mg | ORAL_CAPSULE | Freq: Four times a day (QID) | ORAL | Status: DC | PRN

## 2016-08-02 MED ORDER — WITCH HAZEL-GLYCERIN EX PADS: 1.0000 "application " | MEDICATED_PAD | CUTANEOUS | Status: DC | PRN

## 2016-08-02 MED ORDER — LIDOCAINE HCL (PF) 1 % IJ SOLN
30.0000 mL | INTRAMUSCULAR | Status: DC | PRN
  Filled 2016-08-02: qty 30

## 2016-08-02 MED ORDER — DIBUCAINE 1 % RE OINT: 1.0000 "application " | TOPICAL_OINTMENT | RECTAL | Status: DC | PRN

## 2016-08-02 MED ORDER — SOD CITRATE-CITRIC ACID 500-334 MG/5ML PO SOLN: 30.0000 mL | ORAL | Status: DC | PRN

## 2016-08-02 MED ORDER — SENNOSIDES-DOCUSATE SODIUM 8.6-50 MG PO TABS
2.0000 | ORAL_TABLET | ORAL | Status: DC
  Administered 2016-08-03: 2 via ORAL
  Filled 2016-08-02: qty 2

## 2016-08-02 NOTE — MAU Note (Signed)
Pt states that her water broke at 0330-clear fluid. Reports contractions every 5 mins. +FM. Last SVE 1cm.

## 2016-08-02 NOTE — Anesthesia Preprocedure Evaluation (Signed)
Anesthesia Evaluation  Patient identified by MRN, date of birth, ID band Patient awake    Reviewed: Allergy & Precautions, Patient's Chart, lab work & pertinent test results  Airway Mallampati: III       Dental no notable dental hx. (+) Teeth Intact   Pulmonary neg pulmonary ROS,    Pulmonary exam normal breath sounds clear to auscultation       Cardiovascular negative cardio ROS Normal cardiovascular exam Rhythm:Regular Rate:Normal     Neuro/Psych negative neurological ROS  negative psych ROS   GI/Hepatic negative GI ROS, Neg liver ROS,   Endo/Other  Obesity  Renal/GU negative Renal ROS  negative genitourinary   Musculoskeletal negative musculoskeletal ROS (+)   Abdominal (+) + obese,   Peds  Hematology negative hematology ROS (+)   Anesthesia Other Findings   Reproductive/Obstetrics (+) Pregnancy Uterine fibroids AMA                             Anesthesia Physical Anesthesia Plan  ASA: II  Anesthesia Plan: Epidural   Post-op Pain Management:    Induction:   Airway Management Planned: Natural Airway  Additional Equipment:   Intra-op Plan:   Post-operative Plan:   Informed Consent: I have reviewed the patients History and Physical, chart, labs and discussed the procedure including the risks, benefits and alternatives for the proposed anesthesia with the patient or authorized representative who has indicated his/her understanding and acceptance.     Plan Discussed with: Anesthesiologist  Anesthesia Plan Comments:         Anesthesia Quick Evaluation

## 2016-08-02 NOTE — Progress Notes (Signed)
OB PN:  S: Pt feeling increased pressure in bottom  O: BP (!) 144/72   Pulse 85   Temp 97.9 F (36.6 C) (Oral)   Resp 20   Ht 5\' 8"  (1.727 m)   Wt 209 lb (94.8 kg)   SpO2 96%   BMI 31.78 kg/m   FHT: 115bpm, moderate variablity, + accels, variable and early decels Toco: q2-33min SVE: C/C/+2  A/P: 36 y.o. [id number] @ [redacted]w[redacted]d in active labor 1. FWB: Cat. II 2. Labor: expectant management, pushing started Pain: continue epidural GBS: PCN x 2, continue per protocol  Janyth Pupa, DO [phone] (pager) [phone] (office)

## 2016-08-02 NOTE — H&P (Signed)
Ariana Gutierrez is a 36 y.o. female presenting for ROM that occurred at 0330.  Patient is under the care of [facility] and attended by Dr. Kathlen Mody.  Patient history remarkable as listed below.  Patient does desire epidural for pain management.   Pregnancy complicated by: 1) Uterine fibroids- last [place] @ [redacted]w[redacted]d: vertex/anterior/EFW: 5#3oz (48%) fibroids unchanged from prior US- largest Left LUS- 9cm, Left post- 4.4cm, post fundal 5.2cm, ant 2.4cm.  2) AMA- tetra neg, normal anatomy scan 3) Trich in pregnancy, treated, TOC negative 4) GBS positive, plan for PCN in labor   OB History    Gravida Para Term Preterm AB Living   12 1 1   1 1    SAB TAB Ectopic Multiple Live Births   1 0     1     Past Medical History:  Diagnosis Date  . STI (sexually transmitted infection)    hx of gonorrhea & chlamydia "years ago"  . Uterine fibroid    Past Surgical History:  Procedure Laterality Date  . NO PAST SURGERIES     Family History: family history includes Diabetes in her maternal aunt; Hypertension in her mother. Social History:  reports that she has never smoked. She has never used smokeless tobacco. She reports that she does not drink alcohol or use drugs.     Maternal Diabetes: No Genetic Screening: Declined Maternal Ultrasounds/Referrals: Normal Fetal Ultrasounds or other Referrals:  None Maternal Substance Abuse:  No Significant Maternal Medications:  None Significant Maternal Lab Results:  Lab values include: Group B Strep positive Other Comments:  None  ROS Maternal Medical History:  Reason for admission: Rupture of membranes.   Fetal activity: Perceived fetal activity is normal.      Dilation: 4 Effacement (%): 90 Station: -1 Exam by:: Maryagnes Amos RN Blood pressure 125/81, pulse 100, temperature 98.3 F (36.8 C), temperature source Oral, resp. rate 18, height 5\' 8"  (1.727 m), weight 94.8 kg (209 lb), SpO2 99 %. Maternal Exam:  Uterine Assessment: Contraction  strength is moderate.  Contraction frequency is irregular.   Abdomen: Patient reports no abdominal tenderness. Fetal presentation: vertex  Introitus: Ferning test: positive.  Nitrazine test: not done. Amniotic fluid character: clear.  Cervix: Cervix evaluated by digital exam.     Fetal Exam Fetal Monitor Review: Baseline rate: 130.  Variability: moderate (6-25 bpm).   Pattern: accelerations present and no decelerations.    Fetal State Assessment: Category I - tracings are normal.     Physical Exam  Vitals reviewed. Constitutional: She is oriented to person, place, and time. She appears well-developed and well-nourished.  HENT:  Head: Normocephalic and atraumatic.  Eyes: Conjunctivae are normal.  Neck: Normal range of motion.  Cardiovascular: Normal rate and regular rhythm.   Respiratory: Effort normal.  GI: Soft.  Musculoskeletal: Normal range of motion.  Neurological: She is alert and oriented to person, place, and time.  Skin: Skin is warm and dry.   Examination confirmed this am  Prenatal labs: ABO, Rh: --/--/B POS (09/19 1754) Antibody:  Negative Rubella:  Immune RPR:   NR HBsAg:   Negative HIV:   Negative GBS: Positive (02/15 0000)   TDAP 07/01/16 Flu 10/12  Assessment/Plan: IUP at 37.5wks Cat I FT ROM GBS Positive AMA  Admit to [facility]  Routine Labor and Delivery Orders per [facility] Protocol Will remain in MAU due to BS bed availability Start PCN per protocol Okay for stadol for pain Dr.JO updated on patient arrival and status-To  assume care   Maryann Conners MSN, CNM 08/02/2016, 6:38 AM  Note updated as above -CBC, T&S to be obtained, then plan for epidural -continue with expectant managemetn  Will recheck SVE in 2hr following placement of epidural  Janyth Pupa, DO [phone] (pager) [phone] (office)

## 2016-08-02 NOTE — Anesthesia Procedure Notes (Signed)
Epidural Patient location during procedure: OB Start time: 08/02/2016 7:47 AM  Staffing Anesthesiologist: Josephine Igo Performed: anesthesiologist   Preanesthetic Checklist Completed: patient identified, site marked, surgical consent, pre-op evaluation, timeout performed, IV checked, risks and benefits discussed and monitors and equipment checked  Epidural Patient position: sitting Prep: site prepped and draped and DuraPrep Patient monitoring: continuous pulse ox and blood pressure Approach: midline Location: L3-L4 Injection technique: LOR air  Needle:  Needle type: Tuohy  Needle gauge: 17 G Needle length: 9 cm and 9 Needle insertion depth: 5 cm cm Catheter type: closed end flexible Catheter size: 19 Gauge Catheter at skin depth: 10 cm Test dose: negative and Other  Assessment Events: blood not aspirated, injection not painful, no injection resistance, negative IV test and no paresthesia  Additional Notes Patient identified. Risks and benefits discussed including failed block, incomplete  Pain control, post dural puncture headache, nerve damage, paralysis, blood pressure Changes, nausea, vomiting, reactions to medications-both toxic and allergic and post Partum back pain. All questions were answered. Patient expressed understanding and wished to proceed. Sterile technique was used throughout procedure. Epidural site was Dressed with sterile barrier dressing. No paresthesias, signs of intravascular injection Or signs of intrathecal spread were encountered.  Patient was more comfortable after the epidural was dosed. Please see RN's note for documentation of vital signs and FHR which are stable.

## 2016-08-02 NOTE — Anesthesia Postprocedure Evaluation (Signed)
Anesthesia Post Note  Patient: Ariana Gutierrez  Procedure(s) Performed: * No procedures listed *  Patient location during evaluation: Mother Baby Anesthesia Type: Epidural Level of consciousness: awake and alert Pain management: pain level controlled Vital Signs Assessment: post-procedure vital signs reviewed and stable Respiratory status: spontaneous breathing, nonlabored ventilation and respiratory function stable Cardiovascular status: stable Postop Assessment: no headache, no backache and epidural receding Anesthetic complications: no        Last Vitals:  Vitals:   08/02/16 1500 08/02/16 1558  BP: 124/81 (!) 110/57  Pulse: 72 82  Resp: 20 18  Temp: 36.8 C 36.4 C    Last Pain:  Vitals:   08/02/16 1558  TempSrc: Oral  PainSc:    Pain Goal: Patients Stated Pain Goal: 10 (08/02/16 0801)               Gilmer Mor

## 2016-08-02 NOTE — Progress Notes (Signed)
Notified of pt arrival in MAU and SROM with exam. RN to put in admission orders

## 2016-08-02 NOTE — Anesthesia Pain Management Evaluation Note (Signed)
  CRNA Pain Management Visit Note  Patient: Ariana Gutierrez, 36 y.o., female  "Hello I am a member of the anesthesia team at [facility]. We have an anesthesia team available at all times to provide care throughout the hospital, including epidural management and anesthesia for C-section. I don't know your plan for the delivery whether it a natural birth, water birth, IV sedation, nitrous supplementation, doula or epidural, but we want to meet your pain goals."   1.Was your pain managed to your expectations on prior hospitalizations?   Yes   2.What is your expectation for pain management during this hospitalization?     Epidural  3.How can we help you reach that goal?wants epidural now, ANMD has been called  Record the patient's initial score and the patient's pain goal.   Pain: 10  Pain Goal: 10 [facility] wants you to be able to say your pain was always managed very well.  Sahith Nurse 08/02/2016

## 2016-08-03 LAB — CBC
HCT: 32.9 % — ABNORMAL LOW (ref 36.0–46.0)
Hemoglobin: 11.4 g/dL — ABNORMAL LOW (ref 12.0–15.0)
MCH: 33.4 pg (ref 26.0–34.0)
MCHC: 34.7 g/dL (ref 30.0–36.0)
MCV: 96.5 fL (ref 78.0–100.0)
PLATELETS: 280 10*3/uL (ref 150–400)
RBC: 3.41 MIL/uL — AB (ref 3.87–5.11)
RDW: 14.6 % (ref 11.5–15.5)
WBC: 13.1 10*3/uL — ABNORMAL HIGH (ref 4.0–10.5)

## 2016-08-03 LAB — RPR: RPR: NONREACTIVE

## 2016-08-03 MED ORDER — IBUPROFEN 600 MG PO TABS: 600.0000 mg | ORAL_TABLET | Freq: Four times a day (QID) | ORAL | 0 refills | Status: AC

## 2016-08-03 NOTE — Progress Notes (Signed)
Postpartum Note Day # 1  S:  Patient resting comfortable in bed.  Pain controlled.  Tolerating general diet. + flatus, no BM.  Lochia moderate.  Ambulating without difficulty.  She denies n/v/f/c, SOB, or CP  O: Temp:  [97.6 F (36.4 C)-98.7 F (37.1 C)] 98.4 F (36.9 C) (02/28 0556) Pulse Rate:  [67-105] 76 (02/28 0556) Resp:  [18-20] 18 (02/28 0556) BP: (88-145)/(42-84) 120/71 (02/28 0556) SpO2:  [96 %-100 %] 99 % (02/27 1940) Weight:  [209 lb (94.8 kg)] 209 lb (94.8 kg) (02/27 0700) Gen: A&Ox3, NAD CV: RRR Resp: CTAB Abdomen: soft, NT, ND Uterus: firm, non-tender, below umbilicus Ext: No edema, no calf tenderness bilaterally  Labs:  Recent Labs  08/02/16 0554 08/03/16 0547  HGB 12.4 11.4*    A/P: Pt is a 36 y.o. [id number] s/p NSVD, PPD#1  - Pain well controlled -GU: Voiding freely -GI: Tolerating general diet -Activity: encouraged sitting up to chair and ambulation as tolerated -Prophylaxis: early ambulation -Labs: stable as above []  Pt will check with insurance regarding coverage of circumcision  Janyth Pupa, DO [phone] (pager) [phone] (office)

## 2016-08-03 NOTE — Discharge Instructions (Signed)

## 2016-08-04 NOTE — Discharge Summary (Signed)
OB Discharge Summary     Patient Name: Ariana Gutierrez DOB: September 20, 1980 MRN: [id number]  Date of admission: 08/02/2016 Delivering MD: Janyth Pupa   Date of discharge: 08/03/2016  Admitting diagnosis: 96 WEEKS ROM CTX Intrauterine pregnancy: [redacted]w[redacted]d     Secondary diagnosis:  Active Problems:   Indication for care or intervention in labor or delivery   Advanced maternal age in multigravida   Trichomonal vaginitis in pregnancy in second trimester   Positive GBS test   History of therapeutic abortion   Uterine fibroid  Additional problems: none     Discharge diagnosis: Term Pregnancy Delivered                                                                                                Post partum procedures:none  Augmentation: n/a  Complications: None  Hospital course:  Onset of Labor With Vaginal Delivery     36 y.o. yo [id number] at [redacted]w[redacted]d was admitted in Latent Labor on 08/02/2016. Patient had an uncomplicated labor course as follows:  Membrane Rupture Time/Date: 3:30 AM ,08/02/2016   Intrapartum Procedures: Episiotomy: None [1]                                         Lacerations:  1st degree [2];Vaginal [6]  Patient had a delivery of a Viable infant. 08/02/2016  Information for the patient's newborn:  Adabelle, [place] [id number]  Delivery Method: Vaginal, Spontaneous Delivery (Filed from Delivery Summary)    Pateint had an uncomplicated postpartum course.  She is ambulating, tolerating a regular diet, passing flatus, and urinating well. Patient is discharged home in stable condition on 08/03/16.   Physical exam  Vitals:   08/02/16 1500 08/02/16 1558 08/02/16 1940 08/03/16 0556  BP: 124/81 (!) 110/57 119/63 120/71  Pulse: 72 82 79 76  Resp: 20 18 18 18   Temp: 98.3 F (36.8 C) 97.6 F (36.4 C) 98.1 F (36.7 C) 98.4 F (36.9 C)  TempSrc: Oral Oral Oral Oral  SpO2:  100% 99%   Weight:      Height:       General: alert, cooperative and no distress Lochia:  appropriate Uterine Fundus: firm Incision: N/A DVT Evaluation: No evidence of DVT seen on physical exam. Labs: Lab Results  Component Value Date   WBC 13.1 (H) 08/03/2016   HGB 11.4 (L) 08/03/2016   HCT 32.9 (L) 08/03/2016   MCV 96.5 08/03/2016   PLT 280 08/03/2016   No flowsheet data found.  Discharge instruction: per After Visit Summary and "Baby and Me Booklet".  After visit meds:  Allergies as of 08/03/2016   No Known Allergies     Medication List    TAKE these medications   ibuprofen 600 MG tablet Commonly known as:  ADVIL,MOTRIN Take 1 tablet (600 mg total) by mouth every 6 (six) hours.   prenatal multivitamin Tabs tablet Take 1 tablet by mouth daily at 12 noon.       Diet: routine diet  Activity: Advance  as tolerated. Pelvic rest for 6 weeks.   Outpatient follow up:6 weeks Follow up Appt:No future appointments. Follow up Visit:No Follow-up on file.  Postpartum contraception: Nexplanon  Newborn Data: Live born female  Birth Weight: 6 lb 13.2 oz (3095 g) APGAR: 8, 9  Baby Feeding: Breast Disposition:home with mother   08/04/2016 Janyth Pupa, M, DO

## 2016-10-17 DIAGNOSIS — Z3202 Encounter for pregnancy test, result negative: Secondary | ICD-10-CM | POA: Diagnosis not present

## 2016-10-17 DIAGNOSIS — Z30017 Encounter for initial prescription of implantable subdermal contraceptive: Secondary | ICD-10-CM | POA: Diagnosis not present

## 2017-01-18 DIAGNOSIS — N939 Abnormal uterine and vaginal bleeding, unspecified: Secondary | ICD-10-CM | POA: Diagnosis not present

## 2017-01-18 DIAGNOSIS — Z975 Presence of (intrauterine) contraceptive device: Secondary | ICD-10-CM | POA: Diagnosis not present

## 2018-12-03 DIAGNOSIS — R451 Restlessness and agitation: Secondary | ICD-10-CM | POA: Diagnosis not present

## 2018-12-03 DIAGNOSIS — R454 Irritability and anger: Secondary | ICD-10-CM | POA: Diagnosis not present

## 2018-12-03 DIAGNOSIS — R4589 Other symptoms and signs involving emotional state: Secondary | ICD-10-CM | POA: Diagnosis not present

## 2018-12-03 DIAGNOSIS — F989 Unspecified behavioral and emotional disorders with onset usually occurring in childhood and adolescence: Secondary | ICD-10-CM | POA: Diagnosis not present

## 2018-12-03 DIAGNOSIS — R4689 Other symptoms and signs involving appearance and behavior: Secondary | ICD-10-CM | POA: Diagnosis not present

## 2018-12-03 DIAGNOSIS — Z046 Encounter for general psychiatric examination, requested by authority: Secondary | ICD-10-CM | POA: Diagnosis not present

## 2018-12-03 DIAGNOSIS — F1010 Alcohol abuse, uncomplicated: Secondary | ICD-10-CM | POA: Diagnosis not present

## 2018-12-04 DIAGNOSIS — F1010 Alcohol abuse, uncomplicated: Secondary | ICD-10-CM | POA: Diagnosis not present

## 2018-12-04 DIAGNOSIS — R4689 Other symptoms and signs involving appearance and behavior: Secondary | ICD-10-CM | POA: Diagnosis not present

## 2020-04-28 ENCOUNTER — Emergency Department (HOSPITAL_COMMUNITY)
Admission: EM | Admit: 2020-04-28 | Discharge: 2020-04-28 | Disposition: A | Discharge status: Home / Self Care | Payer: BLUE CROSS/BLUE SHIELD | Attending: Emergency Medicine | Admitting: Emergency Medicine

## 2020-04-28 ENCOUNTER — Other Ambulatory Visit: Payer: Self-pay

## 2020-04-28 ENCOUNTER — Encounter (HOSPITAL_COMMUNITY): Payer: Self-pay | Admitting: Emergency Medicine

## 2020-04-28 DIAGNOSIS — R1010 Upper abdominal pain, unspecified: Secondary | ICD-10-CM

## 2020-04-28 DIAGNOSIS — K859 Acute pancreatitis without necrosis or infection, unspecified: Secondary | ICD-10-CM | POA: Insufficient documentation

## 2020-04-28 LAB — COMPREHENSIVE METABOLIC PANEL
ALT: 23 U/L (ref 0–44)
AST: 32 U/L (ref 15–41)
Albumin: 4.1 g/dL (ref 3.5–5.0)
Alkaline Phosphatase: 90 U/L (ref 38–126)
Anion gap: 12 (ref 5–15)
BUN: 7 mg/dL (ref 6–20)
CO2: 21 mmol/L — ABNORMAL LOW (ref 22–32)
Calcium: 10.1 mg/dL (ref 8.9–10.3)
Chloride: 102 mmol/L (ref 98–111)
Creatinine, Ser: 0.52 mg/dL (ref 0.44–1.00)
GFR, Estimated: >60 mL/min (ref >60)
Glucose, Bld: 105 mg/dL — ABNORMAL HIGH (ref 70–99)
Potassium: 4 mmol/L (ref 3.5–5.1)
Sodium: 135 mmol/L (ref 135–145)
Total Bilirubin: 0.7 mg/dL (ref 0.3–1.2)
Total Protein: 8.2 g/dL — ABNORMAL HIGH (ref 6.5–8.1)

## 2020-04-28 LAB — CBC
HCT: 42.4 % (ref 36.0–46.0)
Hemoglobin: 14.7 g/dL (ref 12.0–15.0)
MCH: 39.5 pg — ABNORMAL HIGH (ref 26.0–34.0)
MCHC: 34.7 g/dL (ref 30.0–36.0)
MCV: 114 fL — ABNORMAL HIGH (ref 80.0–100.0)
Platelets: 307 10*3/uL (ref 150–400)
RBC: 3.72 MIL/uL — ABNORMAL LOW (ref 3.87–5.11)
RDW: 14.6 % (ref 11.5–15.5)
WBC: 9.4 10*3/uL (ref 4.0–10.5)
nRBC: 0 % (ref 0.0–0.2)

## 2020-04-28 LAB — I-STAT BETA HCG BLOOD, ED (MC, WL, AP ONLY): I-stat hCG, quantitative: <5 m[IU]/mL (ref <5)

## 2020-04-28 LAB — LIPASE, BLOOD: Lipase: 144 U/L — ABNORMAL HIGH (ref 11–51)

## 2020-04-28 MED ORDER — ONDANSETRON 4 MG PO TBDP: 4.0000 mg | ORAL_TABLET | Freq: Three times a day (TID) | ORAL | 0 refills | Status: AC | PRN

## 2020-04-28 MED ORDER — SODIUM CHLORIDE 0.9% FLUSH: 3.0000 mL | Freq: Once | INTRAVENOUS | Status: DC

## 2020-04-28 MED ORDER — OXYCODONE-ACETAMINOPHEN 5-325 MG PO TABS: 1.0000 | ORAL_TABLET | ORAL | 0 refills | Status: AC | PRN

## 2020-04-28 NOTE — Discharge Instructions (Addendum)
Clear liquid diet for the next 24 hours, then advance as tolerated. Take medications for symptomatic relief as prescribed. Avoid alcohol.  If you develop high fever, severe pain or uncontrolled vomiting, please return to the emergency department for further treatment.

## 2020-04-28 NOTE — ED Triage Notes (Signed)
Pt arriving with complaint of pancreatitis. Has hx of such. Denies N/V at this time. Pain 8/10.

## 2020-04-28 NOTE — ED Provider Notes (Signed)
[facility] DEPT Provider Note   CSN: [id number] Arrival date & time: 04/28/20  0053     History Chief Complaint  Patient presents with  . Pancreatitis    Ariana Gutierrez is a 39 y.o. female.  Patient with a history of pancreatitis presents with abdominal pain across the upper abdomen, L>R, x 4 days. No nausea or vomiting. No diarrhea. She has been taking Tylenol for pain without relief. Tonight she took 2 shots of liquor to help with pain but denies alcohol use prior to onset of symptoms. No fever, urinary symptoms.   The history is provided by the patient. No language interpreter was used.       Past Medical History:  Diagnosis Date  . STI (sexually transmitted infection)    hx of gonorrhea & chlamydia "years ago"  . Uterine fibroid     Patient Active Problem List   Diagnosis Date Noted  . Indication for care or intervention in labor or delivery 08/02/2016  . Advanced maternal age in multigravida 08/02/2016  . Trichomonal vaginitis in pregnancy in second trimester 08/02/2016  . Positive GBS test 08/02/2016  . History of therapeutic abortion 08/02/2016  . Uterine fibroid 08/02/2016    Past Surgical History:  Procedure Laterality Date  . NO PAST SURGERIES       OB History    Gravida  12   Para  2   Term  2   Preterm      AB  1   Living  2     SAB  1   TAB  0   Ectopic      Multiple  0   Live Births  2           Family History  Problem Relation Age of Onset  . Hypertension Mother   . Diabetes Maternal Aunt   . Asthma Neg Hx   . Cancer Neg Hx   . Heart disease Neg Hx     Social History   Tobacco Use  . Smoking status: Never Smoker  . Smokeless tobacco: Never Used  Substance Use Topics  . Alcohol use: No  . Drug use: No    Home Medications Prior to Admission medications   Medication Sig Start Date End Date Taking? Authorizing Provider  ibuprofen (ADVIL,MOTRIN) 600 MG tablet Take 1 tablet (600  mg total) by mouth every 6 (six) hours. 08/03/16   Janyth Pupa, DO  ondansetron (ZOFRAN ODT) 4 MG disintegrating tablet Take 1 tablet (4 mg total) by mouth every 8 (eight) hours as needed for nausea or vomiting. 04/28/20   Charlann Lange, PA-C  oxyCODONE-acetaminophen (PERCOCET/ROXICET) 5-325 MG tablet Take 1 tablet by mouth every 4 (four) hours as needed for severe pain. 04/28/20   Charlann Lange, PA-C  Prenatal Vit-Fe Fumarate-FA (PRENATAL MULTIVITAMIN) TABS tablet Take 1 tablet by mouth daily at 12 noon.    [provider]    Allergies    Patient has no known allergies.  Review of Systems   Review of Systems  Constitutional: Negative for chills and fever.  HENT: Negative.   Respiratory: Negative.   Cardiovascular: Negative.   Gastrointestinal: Positive for abdominal pain. Negative for diarrhea, nausea and vomiting.  Genitourinary: Negative.   Musculoskeletal: Negative.  Negative for back pain.  Skin: Negative.   Neurological: Negative.     Physical Exam Updated Vital Signs BP (!) 139/108 (BP Location: Left Arm)   Pulse 96   Temp 98.2 F (36.8 C) (Oral)  Resp 18   Ht 5\' 8"  (1.727 m)   Wt 90.7 kg   SpO2 100%   BMI 30.41 kg/m   Physical Exam Vitals and nursing note reviewed.  Constitutional:      General: She is not in acute distress.    Appearance: She is well-developed. She is obese. She is not ill-appearing or toxic-appearing.  HENT:     Head: Normocephalic.  Cardiovascular:     Rate and Rhythm: Normal rate and regular rhythm.  Pulmonary:     Effort: Pulmonary effort is normal.     Breath sounds: Normal breath sounds. No wheezing, rhonchi or rales.  Abdominal:     General: Bowel sounds are normal.     Palpations: Abdomen is soft.     Tenderness: There is abdominal tenderness. There is no guarding or rebound.     Comments: Mild L>R upper abdominal tenderness. No distention. BS+ x 4.   Musculoskeletal:        General: Normal range of motion.      Cervical back: Normal range of motion and neck supple.  Skin:    General: Skin is warm and dry.     Findings: No rash.  Neurological:     Mental Status: She is alert and oriented to person, place, and time.     ED Results / Procedures / Treatments   Labs (all labs ordered are listed, but only abnormal results are displayed) Labs Reviewed  LIPASE, BLOOD - Abnormal; Notable for the following components:      Result Value   Lipase 144 (*)    All other components within normal limits  COMPREHENSIVE METABOLIC PANEL - Abnormal; Notable for the following components:   CO2 21 (*)    Glucose, Bld 105 (*)    Total Protein 8.2 (*)    All other components within normal limits  CBC - Abnormal; Notable for the following components:   RBC 3.72 (*)    MCV 114.0 (*)    MCH 39.5 (*)    All other components within normal limits  URINALYSIS, ROUTINE W REFLEX MICROSCOPIC  I-STAT BETA HCG BLOOD, ED ([facility], [facility], [facility] ONLY)   Results for orders placed or performed during the hospital encounter of 04/28/20  Lipase, blood  Result Value Ref Range   Lipase 144 (H) 11 - 51 U/L  Comprehensive metabolic panel  Result Value Ref Range   Sodium 135 135 - 145 mmol/L   Potassium 4.0 3.5 - 5.1 mmol/L   Chloride 102 98 - 111 mmol/L   CO2 21 (L) 22 - 32 mmol/L   Glucose, Bld 105 (H) 70 - 99 mg/dL   BUN 7 6 - 20 mg/dL   Creatinine, Ser 0.52 0.44 - 1.00 mg/dL   Calcium 10.1 8.9 - 10.3 mg/dL   Total Protein 8.2 (H) 6.5 - 8.1 g/dL   Albumin 4.1 3.5 - 5.0 g/dL   AST 32 15 - 41 U/L   ALT 23 0 - 44 U/L   Alkaline Phosphatase 90 38 - 126 U/L   Total Bilirubin 0.7 0.3 - 1.2 mg/dL   GFR, Estimated >60 >60 mL/min   Anion gap 12 5 - 15  CBC  Result Value Ref Range   WBC 9.4 4.0 - 10.5 K/uL   RBC 3.72 (L) 3.87 - 5.11 MIL/uL   Hemoglobin 14.7 12.0 - 15.0 g/dL   HCT 42.4 36 - 46 %   MCV 114.0 (H) 80.0 - 100.0 fL   MCH 39.5 (H) 26.0 -  34.0 pg   MCHC 34.7 30.0 - 36.0 g/dL   RDW 14.6 11.5 - 15.5 %   Platelets 307 150  - 400 K/uL   nRBC 0.0 0.0 - 0.2 %  I-Stat beta hCG blood, ED  Result Value Ref Range   I-stat hCG, quantitative <5.0 <5 mIU/mL   Comment 3             EKG None  Radiology No results found.  Procedures Procedures (including critical care time)  Medications Ordered in ED Medications  sodium chloride flush (NS) 0.9 % injection 3 mL (has no administration in time range)    ED Course  I have reviewed the triage vital signs and the nursing notes.  Pertinent labs & imaging results that were available during my care of the patient were reviewed by me and considered in my medical decision making (see chart for details).    MDM Rules/Calculators/A&P                          Patient with h/o pancreatitis here with upper abdominal pain x 4 days. No fever.   Lipase found to be elevated to just under 3x normal limit indicating mild pancreatitis given clinical picture. She is in no acute distress, no vomiting or fever. Discussed treatment options and patient would prefer being treated at home, which is felt reasonable. Strict return precautions discussed.   Final Clinical Impression(s) / ED Diagnoses Final diagnoses:  Pain of upper abdomen  Acute pancreatitis, unspecified complication status, unspecified pancreatitis type    Rx / DC Orders ED Discharge Orders         Ordered    oxyCODONE-acetaminophen (PERCOCET/ROXICET) 5-325 MG tablet  Every 4 hours PRN        04/28/20 0254    ondansetron (ZOFRAN ODT) 4 MG disintegrating tablet  Every 8 hours PRN        04/28/20 0254           Charlann Lange, PA-C 04/28/20 2228    Cardama, Grayce Sessions, MD 04/29/20 [phone]

## 2020-09-24 ENCOUNTER — Other Ambulatory Visit: Payer: Self-pay

## 2020-09-24 ENCOUNTER — Encounter (HOSPITAL_COMMUNITY): Payer: Self-pay

## 2020-09-24 ENCOUNTER — Emergency Department (HOSPITAL_COMMUNITY): Payer: Self-pay

## 2020-09-24 ENCOUNTER — Emergency Department (HOSPITAL_COMMUNITY)
Admission: EM | Admit: 2020-09-24 | Discharge: 2020-09-25 | Disposition: A | Discharge status: Home / Self Care | Payer: Self-pay | Attending: Emergency Medicine | Admitting: Emergency Medicine

## 2020-09-24 DIAGNOSIS — F17210 Nicotine dependence, cigarettes, uncomplicated: Secondary | ICD-10-CM | POA: Insufficient documentation

## 2020-09-24 DIAGNOSIS — K852 Alcohol induced acute pancreatitis without necrosis or infection: Secondary | ICD-10-CM | POA: Insufficient documentation

## 2020-09-24 HISTORY — DX: Acute pancreatitis without necrosis or infection, unspecified: K85.90

## 2020-09-24 LAB — URINALYSIS, ROUTINE W REFLEX MICROSCOPIC
Bilirubin Urine: NEGATIVE
Glucose, UA: NEGATIVE mg/dL
Hgb urine dipstick: NEGATIVE
Ketones, ur: 20 mg/dL — AB
Nitrite: NEGATIVE
Protein, ur: NEGATIVE mg/dL
Specific Gravity, Urine: 1.021 (ref 1.005–1.030)
pH: 6 (ref 5.0–8.0)

## 2020-09-24 LAB — COMPREHENSIVE METABOLIC PANEL
ALT: 32 U/L (ref 0–44)
AST: 53 U/L — ABNORMAL HIGH (ref 15–41)
Albumin: 4.2 g/dL (ref 3.5–5.0)
Alkaline Phosphatase: 121 U/L (ref 38–126)
Anion gap: 7 (ref 5–15)
BUN: 7 mg/dL (ref 6–20)
CO2: 26 mmol/L (ref 22–32)
Calcium: 9.2 mg/dL (ref 8.9–10.3)
Chloride: 104 mmol/L (ref 98–111)
Creatinine, Ser: 0.74 mg/dL (ref 0.44–1.00)
GFR, Estimated: >60 mL/min (ref >60)
Glucose, Bld: 126 mg/dL — ABNORMAL HIGH (ref 70–99)
Potassium: 4.5 mmol/L (ref 3.5–5.1)
Sodium: 137 mmol/L (ref 135–145)
Total Bilirubin: 2 mg/dL — ABNORMAL HIGH (ref 0.3–1.2)
Total Protein: 7.5 g/dL (ref 6.5–8.1)

## 2020-09-24 LAB — CBC
HCT: 42.3 % (ref 36.0–46.0)
Hemoglobin: 14.9 g/dL (ref 12.0–15.0)
MCH: 38.7 pg — ABNORMAL HIGH (ref 26.0–34.0)
MCHC: 35.2 g/dL (ref 30.0–36.0)
MCV: 109.9 fL — ABNORMAL HIGH (ref 80.0–100.0)
Platelets: 265 10*3/uL (ref 150–400)
RBC: 3.85 MIL/uL — ABNORMAL LOW (ref 3.87–5.11)
RDW: 14.6 % (ref 11.5–15.5)
WBC: 13.5 10*3/uL — ABNORMAL HIGH (ref 4.0–10.5)
nRBC: 0 % (ref 0.0–0.2)

## 2020-09-24 LAB — RAPID URINE DRUG SCREEN, HOSP PERFORMED
Amphetamines: NOT DETECTED
Barbiturates: NOT DETECTED
Benzodiazepines: NOT DETECTED
Cocaine: NOT DETECTED
Opiates: NOT DETECTED
Tetrahydrocannabinol: NOT DETECTED

## 2020-09-24 LAB — LIPASE, BLOOD: Lipase: 322 U/L — ABNORMAL HIGH (ref 11–51)

## 2020-09-24 LAB — ETHANOL: Alcohol, Ethyl (B): <10 mg/dL (ref <10)

## 2020-09-24 LAB — I-STAT BETA HCG BLOOD, ED (MC, WL, AP ONLY): I-stat hCG, quantitative: <5 m[IU]/mL (ref <5)

## 2020-09-24 MED ORDER — SODIUM CHLORIDE 0.9 % IV SOLN
25.0000 mg | Freq: Four times a day (QID) | INTRAVENOUS | Status: DC | PRN
  Administered 2020-09-24: 25 mg via INTRAVENOUS
  Filled 2020-09-24: qty 25

## 2020-09-24 MED ORDER — ONDANSETRON 4 MG PO TBDP
4.0000 mg | ORAL_TABLET | Freq: Once | ORAL | Status: AC
  Administered 2020-09-24: 4 mg via ORAL
  Filled 2020-09-24: qty 1

## 2020-09-24 MED ORDER — PANTOPRAZOLE SODIUM 40 MG IV SOLR
40.0000 mg | Freq: Once | INTRAVENOUS | Status: AC
  Administered 2020-09-24: 40 mg via INTRAVENOUS
  Filled 2020-09-24: qty 40

## 2020-09-24 MED ORDER — LACTATED RINGERS IV BOLUS
2000.0000 mL | Freq: Once | INTRAVENOUS | Status: AC
  Administered 2020-09-24: 2000 mL via INTRAVENOUS

## 2020-09-24 MED ORDER — IOHEXOL 300 MG/ML  SOLN
100.0000 mL | Freq: Once | INTRAMUSCULAR | Status: AC | PRN
  Administered 2020-09-24: 100 mL via INTRAVENOUS

## 2020-09-24 MED ORDER — HYDROMORPHONE HCL 1 MG/ML IJ SOLN
1.0000 mg | Freq: Once | INTRAMUSCULAR | Status: AC
  Administered 2020-09-24: 1 mg via INTRAVENOUS
  Filled 2020-09-24: qty 1

## 2020-09-24 NOTE — ED Provider Notes (Signed)
[facility] DEPT Provider Note   CSN: [id number] Arrival date & time: 09/24/20  1804     History Chief Complaint  Patient presents with  . Emesis  . Abdominal Pain    Ariana Gutierrez is a 40 y.o. female.  HPI Patient reports she has a history of pancreatitis.  She feels that she has similar symptoms.  She has pain in her upper central abdomen that is wrapping around both sides and into her back.  It is aching and burning in quality.  She has had nausea .  Vomiting started this morning.  No diarrhea.  No fever, no chest pain, no shortness of breath.  Patient reports that she had a long hiatus without an episode of pancreatitis between Christmas and now.  She reports that she did have some alcohol on Easter and then 2 shots yesterday.  It seems to have brought the symptoms on.    Past Medical History:  Diagnosis Date  . Pancreatitis   . STI (sexually transmitted infection)    hx of gonorrhea & chlamydia "years ago"  . Uterine fibroid     Patient Active Problem List   Diagnosis Date Noted  . Indication for care or intervention in labor or delivery 08/02/2016  . Advanced maternal age in multigravida 08/02/2016  . Trichomonal vaginitis in pregnancy in second trimester 08/02/2016  . Positive GBS test 08/02/2016  . History of therapeutic abortion 08/02/2016  . Uterine fibroid 08/02/2016    Past Surgical History:  Procedure Laterality Date  . NO PAST SURGERIES       OB History    Gravida  12   Para  2   Term  2   Preterm      AB  1   Living  2     SAB  1   IAB  0   Ectopic      Multiple  0   Live Births  2           Family History  Problem Relation Age of Onset  . Hypertension Mother   . Diabetes Maternal Aunt   . Asthma Neg Hx   . Cancer Neg Hx   . Heart disease Neg Hx     Social History   Tobacco Use  . Smoking status: Current Every Day Smoker    Packs/day: 0.35    Types: Cigarettes  . Smokeless tobacco:  Never Used  Vaping Use  . Vaping Use: Never used  Substance Use Topics  . Alcohol use: Yes    Comment: occasionally  . Drug use: No    Home Medications Prior to Admission medications   Medication Sig Start Date End Date Taking? Authorizing Provider  acetaminophen (TYLENOL) 500 MG tablet Take 1,000 mg by mouth every 6 (six) hours as needed for moderate pain.   Yes [provider]  HYDROcodone-acetaminophen (NORCO/VICODIN) 5-325 MG tablet Take 1-2 tablets by mouth every 6 (six) hours as needed for moderate pain or severe pain. 09/25/20  Yes Charlesetta Shanks, MD  ondansetron (ZOFRAN ODT) 4 MG disintegrating tablet Take 1 tablet (4 mg total) by mouth every 4 (four) hours as needed for nausea or vomiting. 09/25/20  Yes Charlesetta Shanks, MD  pantoprazole (PROTONIX) 20 MG tablet Take 1 tablet (20 mg total) by mouth daily. 09/25/20  Yes Charlesetta Shanks, MD  ibuprofen (ADVIL,MOTRIN) 600 MG tablet Take 1 tablet (600 mg total) by mouth every 6 (six) hours. Patient not taking: Reported on 09/24/2020 08/03/16  Janyth Pupa, DO  ondansetron (ZOFRAN ODT) 4 MG disintegrating tablet Take 1 tablet (4 mg total) by mouth every 8 (eight) hours as needed for nausea or vomiting. Patient not taking: Reported on 09/24/2020 04/28/20   Charlann Lange, PA-C  oxyCODONE-acetaminophen (PERCOCET/ROXICET) 5-325 MG tablet Take 1 tablet by mouth every 4 (four) hours as needed for severe pain. Patient not taking: Reported on 09/24/2020 04/28/20   Charlann Lange, PA-C    Allergies    Patient has no known allergies.  Review of Systems   Review of Systems 10 systems reviewed and negative except as per HPI Physical Exam Updated Vital Signs BP (!) 140/95   Pulse 72   Temp 97.6 F (36.4 C) (Oral)   Resp 17   Ht 5\' 8"  (1.727 m)   Wt 99.2 kg   SpO2 98%   BMI 33.27 kg/m   Physical Exam Constitutional:      Appearance: Normal appearance. She is well-developed.  HENT:     Head: Normocephalic and atraumatic.      Mouth/Throat:     Pharynx: Oropharynx is clear.  Eyes:     Extraocular Movements: Extraocular movements intact.     Conjunctiva/sclera: Conjunctivae normal.  Cardiovascular:     Rate and Rhythm: Normal rate and regular rhythm.     Heart sounds: Normal heart sounds.  Pulmonary:     Effort: Pulmonary effort is normal.     Breath sounds: Normal breath sounds.  Abdominal:     General: Bowel sounds are normal. There is no distension.     Palpations: Abdomen is soft.     Tenderness: There is abdominal tenderness.     Comments: Moderate epigastric tenderness without guarding.  Lower abdomen is nontender.  Musculoskeletal:        General: Normal range of motion.     Cervical back: Neck supple.  Skin:    General: Skin is warm and dry.  Neurological:     Mental Status: She is alert and oriented to person, place, and time.     GCS: GCS eye subscore is 4. GCS verbal subscore is 5. GCS motor subscore is 6.     Coordination: Coordination normal.     ED Results / Procedures / Treatments   Labs (all labs ordered are listed, but only abnormal results are displayed) Labs Reviewed  LIPASE, BLOOD - Abnormal; Notable for the following components:      Result Value   Lipase 322 (*)    All other components within normal limits  COMPREHENSIVE METABOLIC PANEL - Abnormal; Notable for the following components:   Glucose, Bld 126 (*)    AST 53 (*)    Total Bilirubin 2.0 (*)    All other components within normal limits  CBC - Abnormal; Notable for the following components:   WBC 13.5 (*)    RBC 3.85 (*)    MCV 109.9 (*)    MCH 38.7 (*)    All other components within normal limits  URINALYSIS, ROUTINE W REFLEX MICROSCOPIC - Abnormal; Notable for the following components:   APPearance HAZY (*)    Ketones, ur 20 (*)    Leukocytes,Ua TRACE (*)    Bacteria, UA RARE (*)    All other components within normal limits  ETHANOL  RAPID URINE DRUG SCREEN, HOSP PERFORMED  I-STAT BETA HCG BLOOD, ED ([facility],  [facility], [facility] ONLY)    EKG None  Radiology CT Abdomen Pelvis W Contrast  Result Date: 09/24/2020 CLINICAL DATA:  Epigastric pain, history  of pancreatitis EXAM: CT ABDOMEN AND PELVIS WITH CONTRAST TECHNIQUE: Multidetector CT imaging of the abdomen and pelvis was performed using the standard protocol following bolus administration of intravenous contrast. CONTRAST:  18mL OMNIPAQUE IOHEXOL 300 MG/ML  SOLN COMPARISON:  05/23/2020 FINDINGS: Lower chest: No acute abnormality. Hepatobiliary: Fatty infiltration of the liver is noted. The gallbladder is within normal limits. Pancreas: Pancreas is well visualized without evidence of pancreatic necrosis. Mild peripancreatic inflammatory changes are seen consistent with early pancreatitis. These are somewhat improved when compared with the prior exam however. Spleen: Normal in size without focal abnormality. Adrenals/Urinary Tract: Adrenal glands are within normal limits. Kidneys demonstrate a normal enhancement pattern. No renal calculi or obstructive changes are noted. Bladder is decompressed. Stomach/Bowel: Mild diverticular change of the colon is noted without evidence of diverticulitis. The appendix is within normal limits. Small bowel is unremarkable. Vascular/Lymphatic: No significant vascular findings are present. No enlarged abdominal or pelvic lymph nodes. Reproductive: Uterus again demonstrates calcified uterine fibroid stable in appearance. 3.2 cm simple appearing cyst is noted within the right ovary. Other: No ascites is noted. Small fat containing umbilical hernia is noted. Supraumbilical small hernia is noted as well. Musculoskeletal: No acute or significant osseous findings. IMPRESSION: Changes of early pancreatitis.  No necrosis is noted. Diverticulosis without diverticulitis. Calcified uterine fibroids. 3.2 cm right ovarian cyst. No follow-up imaging recommended. Note: This recommendation does not apply to premenarchal patients and to those with increased  risk (genetic, family history, elevated tumor markers or other high-risk factors) of ovarian cancer. Reference: JACR 2020 Feb; 17(2):248-254 Electronically Signed   By: Inez Catalina M.D.   On: 09/24/2020 23:54    Procedures Procedures   Medications Ordered in ED Medications  promethazine (PHENERGAN) 25 mg in sodium chloride 0.9 % 50 mL IVPB (25 mg Intravenous New Bag/Given 09/24/20 2259)  ondansetron (ZOFRAN-ODT) disintegrating tablet 4 mg (4 mg Oral Given 09/24/20 2148)  lactated ringers bolus 2,000 mL (2,000 mLs Intravenous New Bag/Given 09/24/20 2256)  pantoprazole (PROTONIX) injection 40 mg (40 mg Intravenous Given 09/24/20 2300)  HYDROmorphone (DILAUDID) injection 1 mg (1 mg Intravenous Given 09/24/20 2244)  iohexol (OMNIPAQUE) 300 MG/ML solution 100 mL (100 mLs Intravenous Contrast Given 09/24/20 2322)    ED Course  I have reviewed the triage vital signs and the nursing notes.  Pertinent labs & imaging results that were available during my care of the patient were reviewed by me and considered in my medical decision making (see chart for details).    MDM Rules/Calculators/A&P                          Patient has history of pancreatitis.  Similar symptoms today.  CT confirms pancreatitis with inflammation but less then seen previously.  No signs of necrosis or abscess.  Patient is rehydrated and pain controlled in the emergency department.  At this time will trial outpatient care with pain medications, antiemetics, PPI and dietary instructions.  Return precautions included in discharge instructions. Final Clinical Impression(s) / ED Diagnoses Final diagnoses:  Alcohol-induced acute pancreatitis without infection or necrosis    Rx / DC Orders ED Discharge Orders         Ordered    HYDROcodone-acetaminophen (NORCO/VICODIN) 5-325 MG tablet  Every 6 hours PRN        09/25/20 0008    ondansetron (ZOFRAN ODT) 4 MG disintegrating tablet  Every 4 hours PRN        09/25/20 0008  pantoprazole (PROTONIX) 20 MG tablet  Daily        09/25/20 0008           Charlesetta Shanks, MD 09/25/20 [phone]

## 2020-09-24 NOTE — ED Provider Notes (Signed)
MSE was initiated and I personally evaluated the patient and placed orders (if any) at  6:30 PM on September 24, 2020.  The patient appears stable so that the remainder of the MSE may be completed by another provider.  Patient placed in Quick Look pathway, seen and evaluated   Chief Complaint: abd pain, nausea/vomiting  HPI:   40 y.o. F with PMH/o pancreatitis who presents for evaluation of abd pain and nausea/vomiting that has been ongoing since this morning.  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  She states this feels similar to her previous episodes of pancreatitis.  She reports that at [place], she drank a few shots of vodka.  She also reports yesterday, she drank a few shots of vodka.  She denies any fevers, diarrhea.  ROS: Abdominal pain, nausea/vomiting.    Physical Exam:   Gen: No distress  Neuro: Awake and Alert  Skin: Warm    Focused Exam: Abdomen soft, nondistended.  Tenderness palpation left upper quadrant.  No rigidity, guarding.   Initiation of care has begun. The patient has been counseled on the process, plan, and necessity for staying for the completion/evaluation, and the remainder of the medical screening examination   Portions of this note were generated with Dragon dictation software. Dictation errors may occur despite best attempts at proofreading.     Volanda Napoleon, PA-C 09/24/20 1832    Lucrezia Starch, MD 09/24/20 [phone]

## 2020-09-24 NOTE — ED Triage Notes (Signed)
Patient c/o abdominal pain that radiates into the back and emesis that started today.patient reports a history of pancreatitis. Patient denies fever or diarrhea.

## 2020-09-25 MED ORDER — ONDANSETRON 4 MG PO TBDP: 4.0000 mg | ORAL_TABLET | ORAL | 0 refills | Status: AC | PRN

## 2020-09-25 MED ORDER — HYDROCODONE-ACETAMINOPHEN 5-325 MG PO TABS: 1.0000 | ORAL_TABLET | Freq: Four times a day (QID) | ORAL | 0 refills | Status: AC | PRN

## 2020-09-25 MED ORDER — PANTOPRAZOLE SODIUM 20 MG PO TBEC: 20.0000 mg | DELAYED_RELEASE_TABLET | Freq: Every day | ORAL | 0 refills | Status: AC

## 2020-09-25 NOTE — Discharge Instructions (Signed)
1.  Follow a no fat liquid diet for 24 hours.  If your symptoms are improving, you may start eating very bland low-fat foods.  Read instructions set for pancreatitis. 2.  Make a follow-up appointment with your doctor soon as possible.  If you do not have a doctor, there is a referral number your discharge instructions to help you find 1 3.  Start taking Protonix daily for the next month.  Discuss continued use of this medication with your doctor and follow-up visit. 4.  Return to the emergency department if your pain is worsening, you develop fever, vomiting or other concerning symptoms.

## 2020-09-25 NOTE — ED Notes (Signed)
Pt refused to sign signature pad, but was walked through discharge instructions and expressed understanding.

## 2021-07-09 IMAGING — CT CT ABD-PELV W/ CM
2 of 5 series · 16 of 46 positions shown, 18 images · IV contrast (omnipaque)
Comparison: 05/23/2020

CLINICAL DATA: Epigastric pain, history of pancreatitis

EXAM:
CT ABDOMEN AND PELVIS WITH CONTRAST
TECHNIQUE: Multidetector CT imaging of the abdomen and pelvis was performed
using the standard protocol following bolus administration of
intravenous contrast.
CONTRAST:  100mL OMNIPAQUE IOHEXOL 300 MG/ML  SOLN

[Series 2: axial st · axial · 0.92mm/px · z∈[+998,+1433]mm · 13 of 101 slices shown, 15 images]
[im 7/101  soft-tissue]
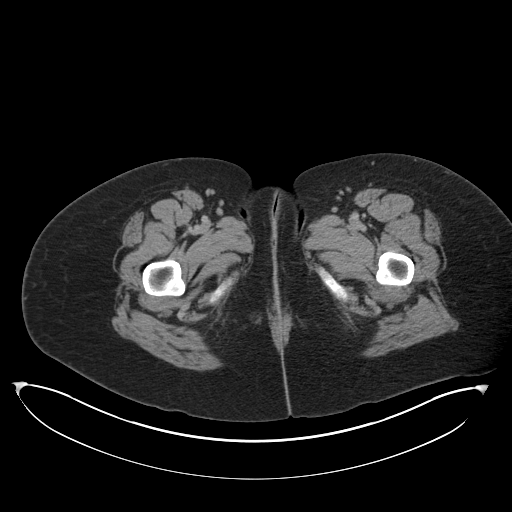
[im 7/101  bone]
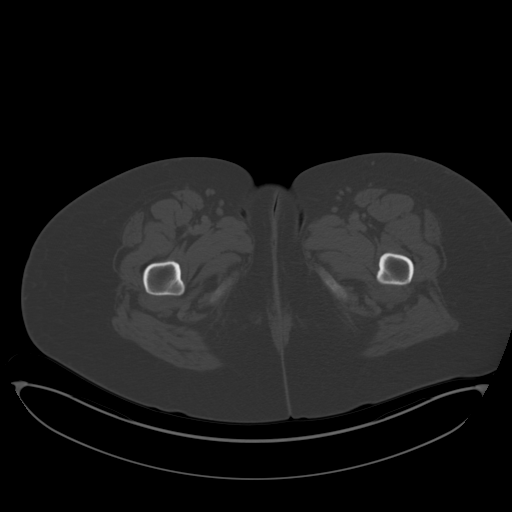
[im 13/101  soft-tissue]
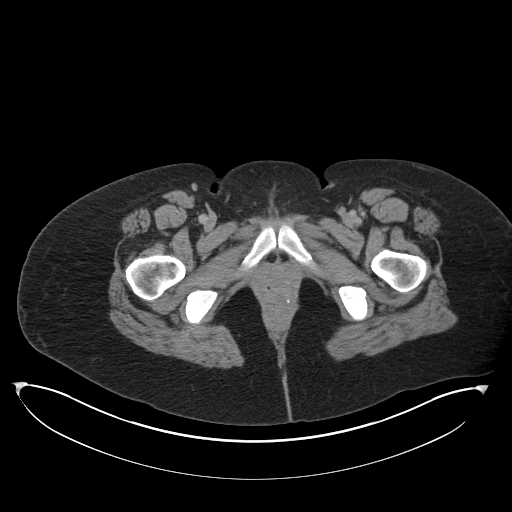
[im 19/101  soft-tissue]
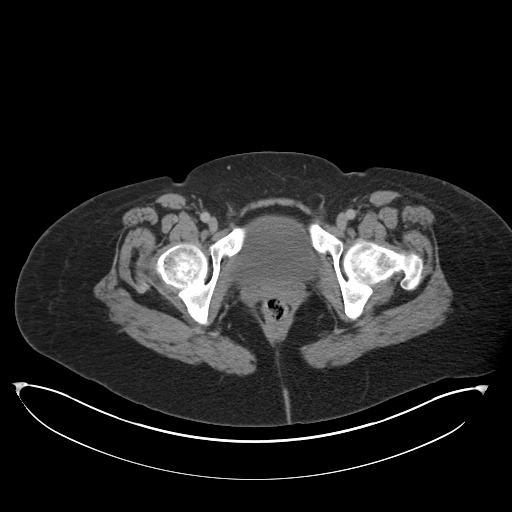
[im 32/101  soft-tissue]
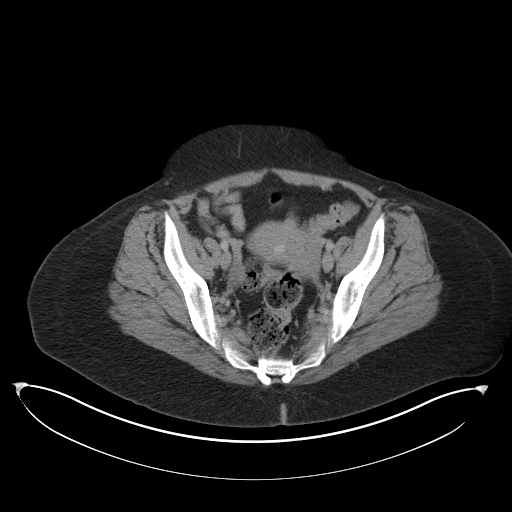
[im 38/101  soft-tissue]
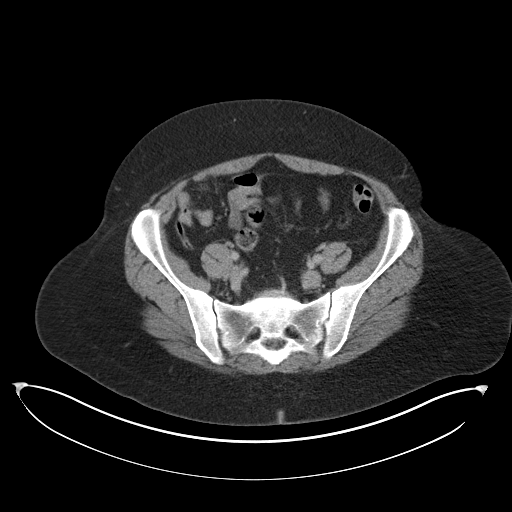
[im 44/101  soft-tissue]
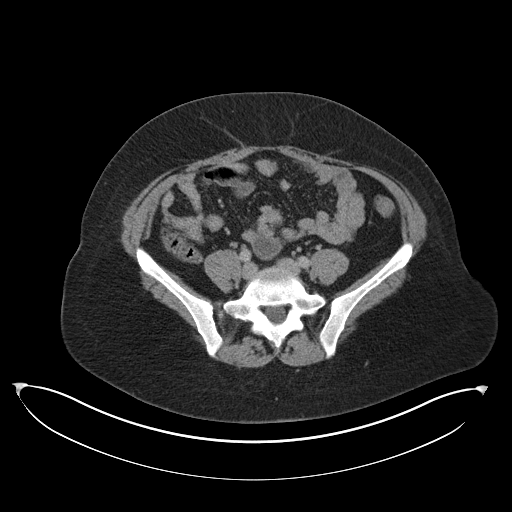
[im 51/101  soft-tissue]
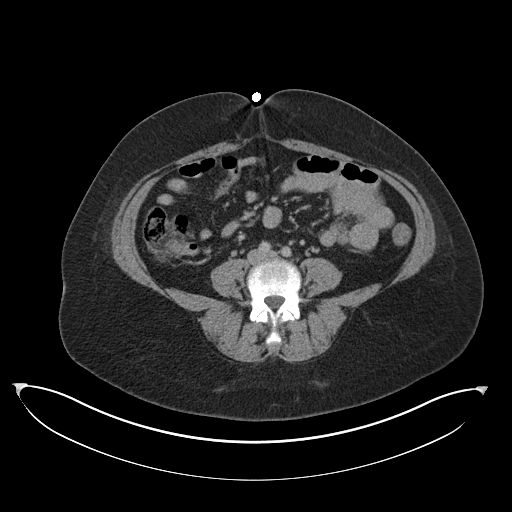
[im 57/101  soft-tissue]
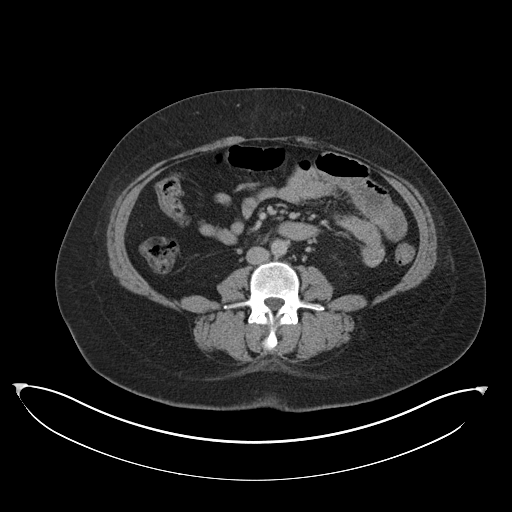
[im 63/101  soft-tissue]
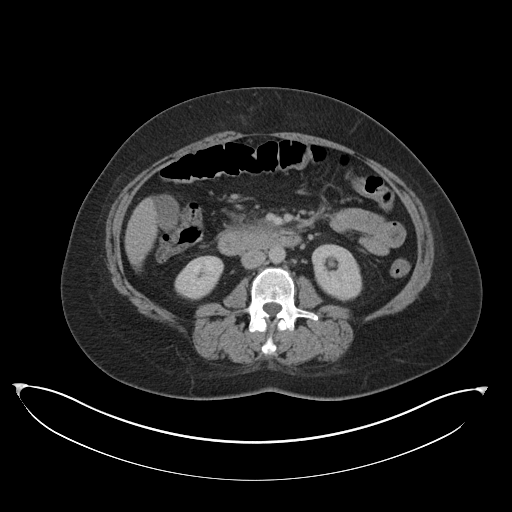
[im 63/101  bone]
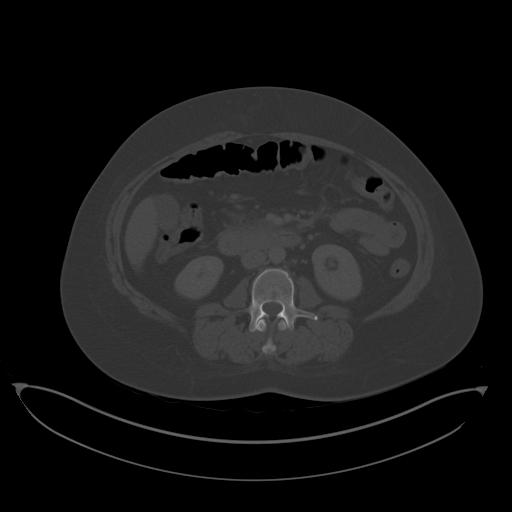
[im 69/101  soft-tissue]
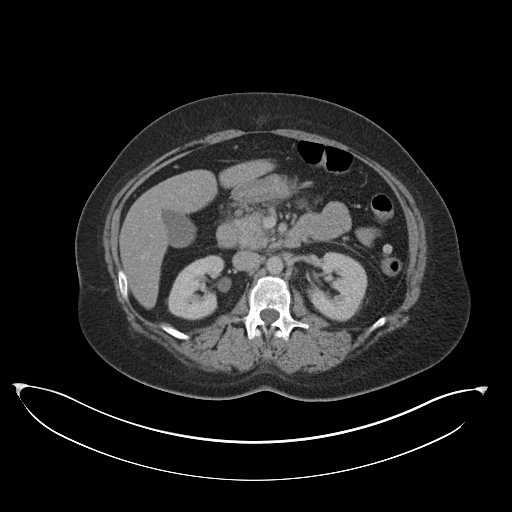
[im 82/101  soft-tissue]
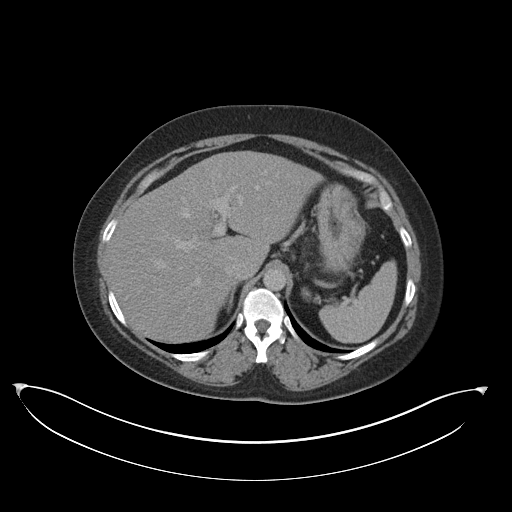
[im 88/101  soft-tissue]
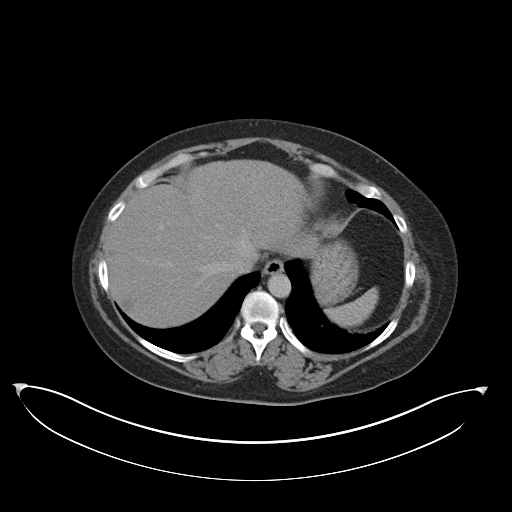
[im 94/101  soft-tissue]
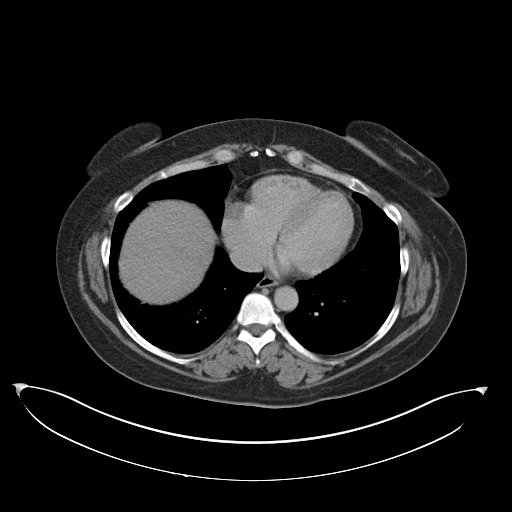

[Series 4: coronal st · coronal · 0.92mm/px · 3 of 164 slices shown]
[im 55/164  soft-tissue]
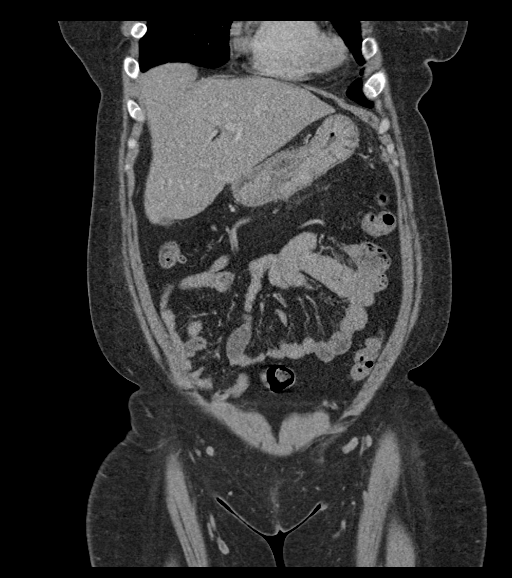
[im 73/164  soft-tissue]
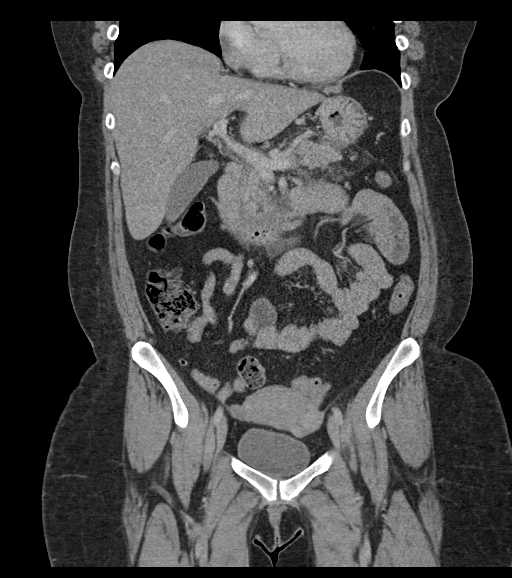
[im 91/164  soft-tissue]
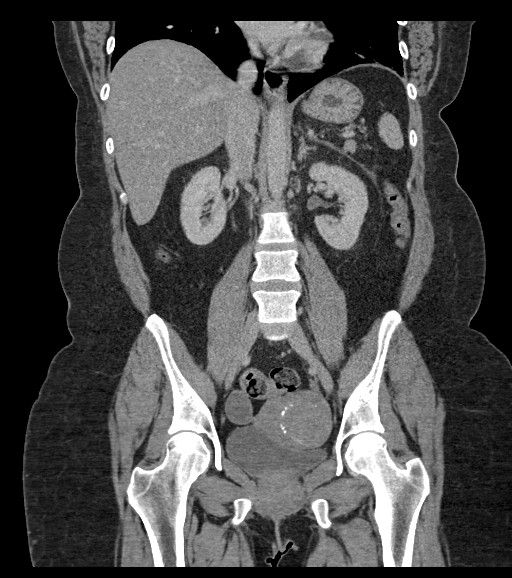

[16 of 46 positions shown; findings below may reference images not displayed]

FINDINGS: Lower chest: No acute abnormality.

Hepatobiliary: Fatty infiltration of the liver is noted. The
gallbladder is within normal limits.

Pancreas: Pancreas is well visualized without evidence of pancreatic
necrosis. Mild peripancreatic inflammatory changes are seen
consistent with early pancreatitis. These are somewhat improved when
compared with the prior exam however.

Spleen: Normal in size without focal abnormality.

Adrenals/Urinary Tract: Adrenal glands are within normal limits.
Kidneys demonstrate a normal enhancement pattern. No renal calculi
or obstructive changes are noted. Bladder is decompressed.

Stomach/Bowel: Mild diverticular change of the colon is noted
without evidence of diverticulitis. The appendix is within normal
limits. Small bowel is unremarkable.

Vascular/Lymphatic: No significant vascular findings are present. No
enlarged abdominal or pelvic lymph nodes.

Reproductive: Uterus again demonstrates calcified uterine fibroid
stable in appearance. 3.2 cm simple appearing cyst is noted within
the right ovary.

Other: No ascites is noted. Small fat containing umbilical hernia is
noted. Supraumbilical small hernia is noted as well.

Musculoskeletal: No acute or significant osseous findings.
IMPRESSION: Changes of early pancreatitis.  No necrosis is noted.

Diverticulosis without diverticulitis.

Calcified uterine fibroids.

3.2 cm right ovarian cyst. No follow-up imaging recommended. Note:
This recommendation does not apply to premenarchal patients and to
those with increased risk (genetic, family history, elevated tumor
markers or other high-risk factors) of ovarian cancer. Reference:
JACR [DATE]):[id number]
